# Patient Record
Sex: Female | Born: 1945 | Race: White | Hispanic: No | Smoking: Never smoker
Health system: Southern US, Community
[De-identification: ages and names within clinical notes are randomized; demographics above are authoritative.]

## PROBLEM LIST (undated history)

## (undated) DIAGNOSIS — J45909 Unspecified asthma, uncomplicated: Secondary | ICD-10-CM

## (undated) DIAGNOSIS — J302 Other seasonal allergic rhinitis: Secondary | ICD-10-CM

## (undated) DIAGNOSIS — I10 Essential (primary) hypertension: Secondary | ICD-10-CM

## (undated) DIAGNOSIS — M2550 Pain in unspecified joint: Secondary | ICD-10-CM

## (undated) DIAGNOSIS — D0510 Intraductal carcinoma in situ of unspecified breast: Secondary | ICD-10-CM

## (undated) DIAGNOSIS — R011 Cardiac murmur, unspecified: Secondary | ICD-10-CM

## (undated) DIAGNOSIS — R112 Nausea with vomiting, unspecified: Secondary | ICD-10-CM

## (undated) DIAGNOSIS — E119 Type 2 diabetes mellitus without complications: Secondary | ICD-10-CM

## (undated) DIAGNOSIS — K219 Gastro-esophageal reflux disease without esophagitis: Secondary | ICD-10-CM

## (undated) DIAGNOSIS — Z9889 Other specified postprocedural states: Secondary | ICD-10-CM

## (undated) DIAGNOSIS — R51 Headache: Secondary | ICD-10-CM

## (undated) DIAGNOSIS — M1990 Unspecified osteoarthritis, unspecified site: Secondary | ICD-10-CM

## (undated) DIAGNOSIS — R519 Headache, unspecified: Secondary | ICD-10-CM

## (undated) HISTORY — DX: Type 2 diabetes mellitus without complications: E11.9

## (undated) HISTORY — PX: TONSILLECTOMY: SUR1361

## (undated) HISTORY — PX: TOE SURGERY: SHX1073

## (undated) HISTORY — DX: Gastro-esophageal reflux disease without esophagitis: K21.9

## (undated) HISTORY — PX: TUBAL LIGATION: SHX77

## (undated) HISTORY — PX: BREAST SURGERY: SHX581

## (undated) HISTORY — PX: HAMMER TOE SURGERY: SHX385

## (undated) HISTORY — DX: Essential (primary) hypertension: I10

## (undated) HISTORY — PX: EYE SURGERY: SHX253

## (undated) HISTORY — DX: Unspecified asthma, uncomplicated: J45.909

---

## 2006-03-04 ENCOUNTER — Emergency Department: Payer: Self-pay | Admitting: Emergency Medicine

## 2008-03-25 ENCOUNTER — Ambulatory Visit: Payer: Self-pay | Admitting: Internal Medicine

## 2010-12-23 ENCOUNTER — Ambulatory Visit: Payer: Self-pay | Admitting: Internal Medicine

## 2011-01-03 ENCOUNTER — Ambulatory Visit: Payer: Self-pay | Admitting: Unknown Physician Specialty

## 2011-12-27 DIAGNOSIS — J45909 Unspecified asthma, uncomplicated: Secondary | ICD-10-CM | POA: Insufficient documentation

## 2012-07-24 DIAGNOSIS — L309 Dermatitis, unspecified: Secondary | ICD-10-CM | POA: Insufficient documentation

## 2014-03-28 DIAGNOSIS — K219 Gastro-esophageal reflux disease without esophagitis: Secondary | ICD-10-CM | POA: Insufficient documentation

## 2014-03-28 DIAGNOSIS — E1122 Type 2 diabetes mellitus with diabetic chronic kidney disease: Secondary | ICD-10-CM | POA: Insufficient documentation

## 2014-03-28 DIAGNOSIS — G4700 Insomnia, unspecified: Secondary | ICD-10-CM | POA: Insufficient documentation

## 2014-03-28 DIAGNOSIS — E785 Hyperlipidemia, unspecified: Secondary | ICD-10-CM | POA: Insufficient documentation

## 2014-09-07 ENCOUNTER — Encounter: Payer: Self-pay | Admitting: Podiatry

## 2014-09-07 ENCOUNTER — Ambulatory Visit (INDEPENDENT_AMBULATORY_CARE_PROVIDER_SITE_OTHER): Payer: Medicare Other | Admitting: Podiatry

## 2014-09-07 VITALS — BP 114/65 | HR 88 | Resp 16 | Ht 62.0 in | Wt 206.0 lb

## 2014-09-07 DIAGNOSIS — M79606 Pain in leg, unspecified: Secondary | ICD-10-CM

## 2014-09-07 DIAGNOSIS — B351 Tinea unguium: Secondary | ICD-10-CM

## 2014-09-07 DIAGNOSIS — Q828 Other specified congenital malformations of skin: Secondary | ICD-10-CM

## 2014-09-07 NOTE — Progress Notes (Signed)
She presents today concerned of a possible infection to the fibular border of the hallux right. She denies any trauma to the foot.  Objective: Vital signs are stable she is alert and oriented 3. Pulses are strongly palpable. Neurologic sensorium is intact. Nail plate appears to be intact margin is well coapted I see no signs of infection  Assessment: Nail dystrophy hallux right.  Plan: Follow up with me as needed

## 2015-05-21 ENCOUNTER — Encounter: Payer: Self-pay | Admitting: *Deleted

## 2015-05-25 NOTE — Discharge Instructions (Signed)

## 2015-05-26 ENCOUNTER — Ambulatory Visit
Admission: RE | Admit: 2015-05-26 | Discharge: 2015-05-26 | Disposition: A | Discharge status: Home / Self Care | Payer: Medicare Other | Source: Ambulatory Visit | Attending: Ophthalmology | Admitting: Ophthalmology

## 2015-05-26 ENCOUNTER — Ambulatory Visit: Payer: Medicare Other | Admitting: Anesthesiology

## 2015-05-26 ENCOUNTER — Encounter
Admission: RE | Disposition: A | Discharge status: Home / Self Care | Payer: Self-pay | Source: Ambulatory Visit | Attending: Ophthalmology

## 2015-05-26 DIAGNOSIS — E780 Pure hypercholesterolemia: Secondary | ICD-10-CM | POA: Insufficient documentation

## 2015-05-26 DIAGNOSIS — Z853 Personal history of malignant neoplasm of breast: Secondary | ICD-10-CM | POA: Insufficient documentation

## 2015-05-26 DIAGNOSIS — J45909 Unspecified asthma, uncomplicated: Secondary | ICD-10-CM | POA: Diagnosis not present

## 2015-05-26 DIAGNOSIS — H2512 Age-related nuclear cataract, left eye: Secondary | ICD-10-CM | POA: Diagnosis not present

## 2015-05-26 DIAGNOSIS — I10 Essential (primary) hypertension: Secondary | ICD-10-CM | POA: Insufficient documentation

## 2015-05-26 DIAGNOSIS — E119 Type 2 diabetes mellitus without complications: Secondary | ICD-10-CM | POA: Insufficient documentation

## 2015-05-26 DIAGNOSIS — R011 Cardiac murmur, unspecified: Secondary | ICD-10-CM | POA: Insufficient documentation

## 2015-05-26 HISTORY — DX: Headache: R51

## 2015-05-26 HISTORY — DX: Nausea with vomiting, unspecified: R11.2

## 2015-05-26 HISTORY — DX: Cardiac murmur, unspecified: R01.1

## 2015-05-26 HISTORY — PX: CATARACT EXTRACTION W/PHACO: SHX586

## 2015-05-26 HISTORY — DX: Other specified postprocedural states: Z98.890

## 2015-05-26 HISTORY — DX: Headache, unspecified: R51.9

## 2015-05-26 HISTORY — DX: Other seasonal allergic rhinitis: J30.2

## 2015-05-26 LAB — GLUCOSE, CAPILLARY
GLUCOSE-CAPILLARY: 131 mg/dL — AB (ref 65–99)
Glucose-Capillary: 125 mg/dL — ABNORMAL HIGH (ref 65–99)

## 2015-05-26 SURGERY — PHACOEMULSIFICATION, CATARACT, WITH IOL INSERTION
Anesthesia: Monitor Anesthesia Care | Laterality: Left | Wound class: Clean

## 2015-05-26 MED ORDER — BRIMONIDINE TARTRATE 0.2 % OP SOLN
OPHTHALMIC | Status: DC | PRN
  Administered 2015-05-26: 1 [drp] via OPHTHALMIC

## 2015-05-26 MED ORDER — EPINEPHRINE HCL 1 MG/ML IJ SOLN
INTRAOCULAR | Status: DC | PRN
  Administered 2015-05-26: 68 mL via OPHTHALMIC

## 2015-05-26 MED ORDER — LACTATED RINGERS IV SOLN: INTRAVENOUS | Status: DC

## 2015-05-26 MED ORDER — ARMC OPHTHALMIC DILATING GEL
1.0000 "application " | OPHTHALMIC | Status: DC | PRN
  Administered 2015-05-26 (×2): 1 via OPHTHALMIC

## 2015-05-26 MED ORDER — TETRACAINE HCL 0.5 % OP SOLN
1.0000 [drp] | OPHTHALMIC | Status: DC | PRN
  Administered 2015-05-26: 1 [drp] via OPHTHALMIC

## 2015-05-26 MED ORDER — NA HYALUR & NA CHOND-NA HYALUR 0.4-0.35 ML IO KIT
PACK | INTRAOCULAR | Status: DC | PRN
  Administered 2015-05-26: 1 mL via INTRAOCULAR

## 2015-05-26 MED ORDER — ONDANSETRON HCL 4 MG/2ML IJ SOLN
INTRAMUSCULAR | Status: DC | PRN
  Administered 2015-05-26: 4 mg via INTRAVENOUS

## 2015-05-26 MED ORDER — FENTANYL CITRATE (PF) 100 MCG/2ML IJ SOLN
INTRAMUSCULAR | Status: DC | PRN
  Administered 2015-05-26: 50 ug via INTRAVENOUS

## 2015-05-26 MED ORDER — CEFUROXIME OPHTHALMIC INJECTION 1 MG/0.1 ML
INJECTION | OPHTHALMIC | Status: DC | PRN
  Administered 2015-05-26: .3 mL via INTRACAMERAL

## 2015-05-26 MED ORDER — TIMOLOL MALEATE 0.5 % OP SOLN
OPHTHALMIC | Status: DC | PRN
  Administered 2015-05-26: 1 [drp] via OPHTHALMIC

## 2015-05-26 MED ORDER — MIDAZOLAM HCL 2 MG/2ML IJ SOLN
INTRAMUSCULAR | Status: DC | PRN
  Administered 2015-05-26: 1.5 mg via INTRAVENOUS

## 2015-05-26 MED ORDER — POVIDONE-IODINE 5 % OP SOLN
1.0000 "application " | OPHTHALMIC | Status: DC | PRN
  Administered 2015-05-26: 1 via OPHTHALMIC

## 2015-05-26 SURGICAL SUPPLY — 26 items
CANNULA ANT/CHMB 27GA (MISCELLANEOUS) ×3 IMPLANT
GLOVE SURG LX 7.5 STRW (GLOVE) ×2
GLOVE SURG LX STRL 7.5 STRW (GLOVE) ×1 IMPLANT
GLOVE SURG TRIUMPH 8.0 PF LTX (GLOVE) ×3 IMPLANT
GOWN STRL REUS W/ TWL LRG LVL3 (GOWN DISPOSABLE) ×2 IMPLANT
GOWN STRL REUS W/TWL LRG LVL3 (GOWN DISPOSABLE) ×4
LENS IOL TECNIS 19.5 (Intraocular Lens) ×3 IMPLANT
LENS IOL TECNIS MONO 1P 19.5 (Intraocular Lens) ×1 IMPLANT
MARKER SKIN SURG W/RULER VIO (MISCELLANEOUS) ×3 IMPLANT
NDL RETROBULBAR .5 NSTRL (NEEDLE) IMPLANT
NEEDLE FILTER BLUNT 18X 1/2SAF (NEEDLE) ×2
NEEDLE FILTER BLUNT 18X1 1/2 (NEEDLE) ×1 IMPLANT
PACK CATARACT BRASINGTON (MISCELLANEOUS) ×3 IMPLANT
PACK EYE AFTER SURG (MISCELLANEOUS) ×3 IMPLANT
PACK OPTHALMIC (MISCELLANEOUS) ×3 IMPLANT
RING MALYGIN 7.0 (MISCELLANEOUS) IMPLANT
SUT ETHILON 10-0 CS-B-6CS-B-6 (SUTURE)
SUT VICRYL  9 0 (SUTURE)
SUT VICRYL 9 0 (SUTURE) IMPLANT
SUTURE EHLN 10-0 CS-B-6CS-B-6 (SUTURE) IMPLANT
SYR 3ML LL SCALE MARK (SYRINGE) ×3 IMPLANT
SYR 5ML LL (SYRINGE) IMPLANT
SYR TB 1ML LUER SLIP (SYRINGE) ×3 IMPLANT
WATER STERILE IRR 250ML POUR (IV SOLUTION) ×3 IMPLANT
WATER STERILE IRR 500ML POUR (IV SOLUTION) IMPLANT
WIPE NON LINTING 3.25X3.25 (MISCELLANEOUS) ×3 IMPLANT

## 2015-05-26 NOTE — Transfer of Care (Signed)
Immediate Anesthesia Transfer of Care Note  Patient: Ruth Harris  Procedure(s) Performed: Procedure(s) with comments: CATARACT EXTRACTION PHACO AND INTRAOCULAR LENS PLACEMENT (IOC) (Left) - DIABETIC  Patient Location: PACU  Anesthesia Type: MAC  Level of Consciousness: awake, alert  and patient cooperative  Airway and Oxygen Therapy: Patient Spontanous Breathing and Patient connected to supplemental oxygen  Post-op Assessment: Post-op Vital signs reviewed, Patient's Cardiovascular Status Stable, Respiratory Function Stable, Patent Airway and No signs of Nausea or vomiting  Post-op Vital Signs: Reviewed and stable  Complications: No apparent anesthesia complications

## 2015-05-26 NOTE — Anesthesia Postprocedure Evaluation (Signed)
  Anesthesia Post-op Note  Patient: Ruth Harris  Procedure(s) Performed: Procedure(s) with comments: CATARACT EXTRACTION PHACO AND INTRAOCULAR LENS PLACEMENT (IOC) (Left) - DIABETIC  Anesthesia type:MAC  Patient location: PACU  Post pain: Pain level controlled  Post assessment: Post-op Vital signs reviewed, Patient's Cardiovascular Status Stable, Respiratory Function Stable, Patent Airway and No signs of Nausea or vomiting  Post vital signs: Reviewed and stable  Last Vitals:  Filed Vitals:   05/26/15 0809  BP: 138/65  Pulse: 77  Temp: 36.2 C  Resp: 17    Level of consciousness: awake, alert  and patient cooperative  Complications: No apparent anesthesia complications

## 2015-05-26 NOTE — Anesthesia Procedure Notes (Signed)
Procedure Name: MAC Performed by: Luby Seamans Pre-anesthesia Checklist: Patient identified, Emergency Drugs available, Suction available, Timeout performed and Patient being monitored Patient Re-evaluated:Patient Re-evaluated prior to inductionOxygen Delivery Method: Nasal cannula Placement Confirmation: positive ETCO2       

## 2015-05-26 NOTE — Op Note (Signed)
OPERATIVE NOTE  LOUIS GAW [id number] 05/26/2015   PREOPERATIVE DIAGNOSIS:  Nuclear sclerotic cataract left eye. H25.12   POSTOPERATIVE DIAGNOSIS:    Nuclear sclerotic cataract left eye.     PROCEDURE:  Phacoemusification with posterior chamber intraocular lens placement of the left eye   LENS:   Implant Name Type Inv. Item Serial No. Manufacturer Lot No. LRB No. Used  LENS IMPL INTRAOC ZCB00 19.5 - [id number] Intraocular Lens LENS IMPL INTRAOC ZCB00 19.5 [id number] AMO   Left 1        ULTRASOUND TIME: 16.3  % of 1 minutes 22 seconds, CDE 13.4  SURGEON:  Wyonia Hough, MD   ANESTHESIA:  Topical with tetracaine drops and 2% Xylocaine jelly.   COMPLICATIONS:  None.   DESCRIPTION OF PROCEDURE:  The patient was identified in the holding room and transported to the operating room and placed in the supine position under the operating microscope.  The left eye was identified as the operative eye and it was prepped and draped in the usual sterile ophthalmic fashion.   A 1 millimeter clear-corneal paracentesis was made at the 1:30 position.  The anterior chamber was filled with Viscoat viscoelastic.  A 2.4 millimeter keratome was used to make a near-clear corneal incision at the 10:30 position.  .  A curvilinear capsulorrhexis was made with a cystotome and capsulorrhexis forceps.  Balanced salt solution was used to hydrodissect and hydrodelineate the nucleus.   Phacoemulsification was then used in stop and chop fashion to remove the lens nucleus and epinucleus.  The remaining cortex was then removed using the irrigation and aspiration handpiece. Provisc was then placed into the capsular bag to distend it for lens placement.  A lens was then injected into the capsular bag.  The remaining viscoelastic was aspirated.   Wounds were hydrated with balanced salt solution.  The anterior chamber was inflated to a physiologic pressure with balanced salt solution.  No wound leaks were noted.  Cefuroxime 0.1 ml of a 10mg /ml solution was injected into the anterior chamber for a dose of 1 mg of intracameral antibiotic at the completion of the case.   Timolol and Brimonidine drops were applied to the eye.  The patient was taken to the recovery room in stable condition without complications of anesthesia or surgery.  Fleetwood Pierron 05/26/2015, 8:11 AM

## 2015-05-26 NOTE — H&P (Signed)
  The History and Physical notes were scanned in.  The patient remains stable and unchanged from the H&P.   Previous H&P reviewed, patient examined, and there are no changes.  Ruth Harris 05/26/2015 7:37 AM

## 2015-05-26 NOTE — Anesthesia Preprocedure Evaluation (Signed)
Anesthesia Evaluation  Patient identified by MRN, date of birth, ID band  Reviewed: Allergy & Precautions, H&P , NPO status , Patient's Chart, lab work & pertinent test results  Airway Mallampati: II  TM Distance: >3 FB Neck ROM: full    Dental no notable dental hx.    Pulmonary    Pulmonary exam normal       Cardiovascular hypertension, Rhythm:regular Rate:Normal     Neuro/Psych    GI/Hepatic   Endo/Other  diabetes  Renal/GU      Musculoskeletal   Abdominal   Peds  Hematology   Anesthesia Other Findings   Reproductive/Obstetrics                             Anesthesia Physical Anesthesia Plan  ASA: II  Anesthesia Plan: MAC   Post-op Pain Management:    Induction:   Airway Management Planned:   Additional Equipment:   Intra-op Plan:   Post-operative Plan:   Informed Consent: I have reviewed the patients History and Physical, chart, labs and discussed the procedure including the risks, benefits and alternatives for the proposed anesthesia with the patient or authorized representative who has indicated his/her understanding and acceptance.     Plan Discussed with: CRNA  Anesthesia Plan Comments:         Anesthesia Quick Evaluation

## 2015-05-27 ENCOUNTER — Encounter: Payer: Self-pay | Admitting: Ophthalmology

## 2015-06-04 ENCOUNTER — Encounter: Payer: Self-pay | Admitting: *Deleted

## 2015-06-08 NOTE — Discharge Instructions (Signed)

## 2015-06-09 ENCOUNTER — Encounter
Admission: RE | Disposition: A | Discharge status: Home / Self Care | Payer: Self-pay | Source: Ambulatory Visit | Attending: Ophthalmology

## 2015-06-09 ENCOUNTER — Ambulatory Visit
Admission: RE | Admit: 2015-06-09 | Discharge: 2015-06-09 | Disposition: A | Discharge status: Home / Self Care | Payer: Medicare Other | Source: Ambulatory Visit | Attending: Ophthalmology | Admitting: Ophthalmology

## 2015-06-09 ENCOUNTER — Ambulatory Visit: Payer: Medicare Other | Admitting: Anesthesiology

## 2015-06-09 DIAGNOSIS — E119 Type 2 diabetes mellitus without complications: Secondary | ICD-10-CM | POA: Insufficient documentation

## 2015-06-09 DIAGNOSIS — I10 Essential (primary) hypertension: Secondary | ICD-10-CM | POA: Diagnosis not present

## 2015-06-09 DIAGNOSIS — E780 Pure hypercholesterolemia: Secondary | ICD-10-CM | POA: Diagnosis not present

## 2015-06-09 DIAGNOSIS — J45909 Unspecified asthma, uncomplicated: Secondary | ICD-10-CM | POA: Diagnosis not present

## 2015-06-09 DIAGNOSIS — R011 Cardiac murmur, unspecified: Secondary | ICD-10-CM | POA: Diagnosis not present

## 2015-06-09 DIAGNOSIS — Z853 Personal history of malignant neoplasm of breast: Secondary | ICD-10-CM | POA: Insufficient documentation

## 2015-06-09 DIAGNOSIS — H2511 Age-related nuclear cataract, right eye: Secondary | ICD-10-CM | POA: Insufficient documentation

## 2015-06-09 HISTORY — PX: CATARACT EXTRACTION W/PHACO: SHX586

## 2015-06-09 LAB — GLUCOSE, CAPILLARY
GLUCOSE-CAPILLARY: 126 mg/dL — AB (ref 65–99)
Glucose-Capillary: 129 mg/dL — ABNORMAL HIGH (ref 65–99)

## 2015-06-09 SURGERY — PHACOEMULSIFICATION, CATARACT, WITH IOL INSERTION
Anesthesia: Monitor Anesthesia Care | Laterality: Right | Wound class: Clean

## 2015-06-09 MED ORDER — BRIMONIDINE TARTRATE 0.2 % OP SOLN
OPHTHALMIC | Status: DC | PRN
  Administered 2015-06-09: 1 [drp] via OPHTHALMIC

## 2015-06-09 MED ORDER — FENTANYL CITRATE (PF) 100 MCG/2ML IJ SOLN
INTRAMUSCULAR | Status: DC | PRN
  Administered 2015-06-09: 50 ug via INTRAVENOUS

## 2015-06-09 MED ORDER — LACTATED RINGERS IV SOLN: INTRAVENOUS | Status: DC

## 2015-06-09 MED ORDER — ARMC OPHTHALMIC DILATING GEL
1.0000 "application " | OPHTHALMIC | Status: DC | PRN
  Administered 2015-06-09 (×2): 1 via OPHTHALMIC

## 2015-06-09 MED ORDER — CEFUROXIME OPHTHALMIC INJECTION 1 MG/0.1 ML
INJECTION | OPHTHALMIC | Status: DC | PRN
  Administered 2015-06-09: 0.1 mL via INTRACAMERAL

## 2015-06-09 MED ORDER — PROPARACAINE HCL 0.5 % OP SOLN
1.0000 [drp] | Freq: Once | OPHTHALMIC | Status: AC
  Administered 2015-06-09: 1 [drp] via OPHTHALMIC

## 2015-06-09 MED ORDER — TIMOLOL MALEATE 0.5 % OP SOLN
OPHTHALMIC | Status: DC | PRN
  Administered 2015-06-09: 1 [drp] via OPHTHALMIC

## 2015-06-09 MED ORDER — MIDAZOLAM HCL 2 MG/2ML IJ SOLN
INTRAMUSCULAR | Status: DC | PRN
  Administered 2015-06-09: 2 mg via INTRAVENOUS

## 2015-06-09 MED ORDER — POVIDONE-IODINE 5 % OP SOLN
1.0000 "application " | OPHTHALMIC | Status: DC | PRN
  Administered 2015-06-09: 1 via OPHTHALMIC

## 2015-06-09 MED ORDER — EPINEPHRINE HCL 1 MG/ML IJ SOLN
INTRAOCULAR | Status: DC | PRN
  Administered 2015-06-09: 80 mL via OPHTHALMIC

## 2015-06-09 MED ORDER — NA HYALUR & NA CHOND-NA HYALUR 0.4-0.35 ML IO KIT
PACK | INTRAOCULAR | Status: DC | PRN
  Administered 2015-06-09: 1 mL via INTRAOCULAR

## 2015-06-09 SURGICAL SUPPLY — 26 items
CANNULA ANT/CHMB 27GA (MISCELLANEOUS) ×3 IMPLANT
GLOVE SURG LX 7.5 STRW (GLOVE) ×2
GLOVE SURG LX STRL 7.5 STRW (GLOVE) ×1 IMPLANT
GLOVE SURG TRIUMPH 8.0 PF LTX (GLOVE) ×3 IMPLANT
GOWN STRL REUS W/ TWL LRG LVL3 (GOWN DISPOSABLE) ×2 IMPLANT
GOWN STRL REUS W/TWL LRG LVL3 (GOWN DISPOSABLE) ×4
LENS IOL TECNIS 19.5 (Intraocular Lens) ×3 IMPLANT
LENS IOL TECNIS MONO 1P 19.5 (Intraocular Lens) ×1 IMPLANT
MARKER SKIN SURG W/RULER VIO (MISCELLANEOUS) ×3 IMPLANT
NDL RETROBULBAR .5 NSTRL (NEEDLE) IMPLANT
NEEDLE FILTER BLUNT 18X 1/2SAF (NEEDLE) ×2
NEEDLE FILTER BLUNT 18X1 1/2 (NEEDLE) ×1 IMPLANT
PACK CATARACT BRASINGTON (MISCELLANEOUS) ×3 IMPLANT
PACK EYE AFTER SURG (MISCELLANEOUS) ×3 IMPLANT
PACK OPTHALMIC (MISCELLANEOUS) ×3 IMPLANT
RING MALYGIN 7.0 (MISCELLANEOUS) IMPLANT
SUT ETHILON 10-0 CS-B-6CS-B-6 (SUTURE)
SUT VICRYL  9 0 (SUTURE)
SUT VICRYL 9 0 (SUTURE) IMPLANT
SUTURE EHLN 10-0 CS-B-6CS-B-6 (SUTURE) IMPLANT
SYR 3ML LL SCALE MARK (SYRINGE) ×3 IMPLANT
SYR 5ML LL (SYRINGE) IMPLANT
SYR TB 1ML LUER SLIP (SYRINGE) ×3 IMPLANT
WATER STERILE IRR 250ML POUR (IV SOLUTION) ×3 IMPLANT
WATER STERILE IRR 500ML POUR (IV SOLUTION) IMPLANT
WIPE NON LINTING 3.25X3.25 (MISCELLANEOUS) ×3 IMPLANT

## 2015-06-09 NOTE — Op Note (Signed)
LOCATION:  [place]   PREOPERATIVE DIAGNOSIS:    Nuclear sclerotic cataract right eye. H25.11   POSTOPERATIVE DIAGNOSIS:  Nuclear sclerotic cataract right eye.     PROCEDURE:  Phacoemusification with posterior chamber intraocular lens placement of the right eye   LENS:   Implant Name Type Inv. Item Serial No. Manufacturer Lot No. LRB No. Used  LENS IMPL INTRAOC ZCB00 19.5 - [id number] Intraocular Lens LENS IMPL INTRAOC ZCB00 19.5 [id number] AMO   Right 1        ULTRASOUND TIME: 13 % of 1 minutes, 22 seconds.  CDE 10.9   SURGEON:  Wyonia Hough, MD   ANESTHESIA:  Topical with tetracaine drops and 2% Xylocaine jelly.   COMPLICATIONS:  None.   DESCRIPTION OF PROCEDURE:  The patient was identified in the holding room and transported to the operating room and placed in the supine position under the operating microscope.  The right eye was identified as the operative eye and it was prepped and draped in the usual sterile ophthalmic fashion.   A 1 millimeter clear-corneal paracentesis was made at the 12:00 position.  The anterior chamber was filled with Viscoat viscoelastic.  A 2.4 millimeter keratome was used to make a near-clear corneal incision at the 9:00 position.  A curvilinear capsulorrhexis was made with a cystotome and capsulorrhexis forceps.  Balanced salt solution was used to hydrodissect and hydrodelineate the nucleus.   Phacoemulsification was then used in stop and chop fashion to remove the lens nucleus and epinucleus.  The remaining cortex was then removed using the irrigation and aspiration handpiece. Provisc was then placed into the capsular bag to distend it for lens placement.  A lens was then injected into the capsular bag.  The remaining viscoelastic was aspirated.   Wounds were hydrated with balanced salt solution.  The anterior chamber was inflated to a physiologic pressure with balanced salt solution.  No wound leaks were noted. Cefuroxime 0.1 ml of a  10mg /ml solution was injected into the anterior chamber for a dose of 1 mg of intracameral antibiotic at the completion of the case.   Timolol and Brimonidine drops were applied to the eye.  The patient was taken to the recovery room in stable condition without complications of anesthesia or surgery.   BRASINGTON,CHADWICK 06/09/2015, 7:59 AM

## 2015-06-09 NOTE — Anesthesia Preprocedure Evaluation (Signed)
Anesthesia Evaluation  Patient identified by MRN, date of birth, ID band  Reviewed: Allergy & Precautions, H&P , NPO status , Patient's Chart, lab work & pertinent test results  Airway Mallampati: II  TM Distance: >3 FB Neck ROM: full    Dental no notable dental hx.    Pulmonary asthma ,    Pulmonary exam normal       Cardiovascular hypertension, Rhythm:regular Rate:Normal     Neuro/Psych    GI/Hepatic   Endo/Other  diabetes  Renal/GU      Musculoskeletal   Abdominal   Peds  Hematology   Anesthesia Other Findings   Reproductive/Obstetrics                             Anesthesia Physical Anesthesia Plan  ASA: II  Anesthesia Plan: MAC   Post-op Pain Management:    Induction:   Airway Management Planned:   Additional Equipment:   Intra-op Plan:   Post-operative Plan:   Informed Consent: I have reviewed the patients History and Physical, chart, labs and discussed the procedure including the risks, benefits and alternatives for the proposed anesthesia with the patient or authorized representative who has indicated his/her understanding and acceptance.     Plan Discussed with: CRNA  Anesthesia Plan Comments:         Anesthesia Quick Evaluation

## 2015-06-09 NOTE — Anesthesia Procedure Notes (Signed)
Procedure Name: MAC Performed by: Ladina Shutters Pre-anesthesia Checklist: Patient identified, Emergency Drugs available, Suction available, Timeout performed and Patient being monitored Patient Re-evaluated:Patient Re-evaluated prior to inductionOxygen Delivery Method: Nasal cannula Placement Confirmation: positive ETCO2     

## 2015-06-09 NOTE — Anesthesia Postprocedure Evaluation (Signed)
  Anesthesia Post-op Note  Patient: Ruth Harris  Procedure(s) Performed: Procedure(s) with comments: CATARACT EXTRACTION PHACO AND INTRAOCULAR LENS PLACEMENT (IOC) (Right) - DIABETIC - oral meds  Anesthesia type:MAC  Patient location: PACU  Post pain: Pain level controlled  Post assessment: Post-op Vital signs reviewed, Patient's Cardiovascular Status Stable, Respiratory Function Stable, Patent Airway and No signs of Nausea or vomiting  Post vital signs: Reviewed and stable  Last Vitals:  Filed Vitals:   06/09/15 0815  BP: 143/66  Pulse: 79  Temp:   Resp: 15    Level of consciousness: awake, alert  and patient cooperative  Complications: No apparent anesthesia complications

## 2015-06-09 NOTE — Transfer of Care (Signed)
Immediate Anesthesia Transfer of Care Note  Patient: Ruth Harris  Procedure(s) Performed: Procedure(s) with comments: CATARACT EXTRACTION PHACO AND INTRAOCULAR LENS PLACEMENT (IOC) (Right) - DIABETIC - oral meds  Patient Location: PACU  Anesthesia Type: MAC  Level of Consciousness: awake, alert  and patient cooperative  Airway and Oxygen Therapy: Patient Spontanous Breathing and Patient connected to supplemental oxygen  Post-op Assessment: Post-op Vital signs reviewed, Patient's Cardiovascular Status Stable, Respiratory Function Stable, Patent Airway and No signs of Nausea or vomiting  Post-op Vital Signs: Reviewed and stable  Complications: No apparent anesthesia complications

## 2015-06-09 NOTE — H&P (Signed)
  The History and Physical notes were scanned in.  The patient remains stable and unchanged from the H&P.   Previous H&P reviewed, patient examined, and there are no changes.  Ruth Harris 06/09/2015 7:37 AM

## 2015-06-10 ENCOUNTER — Encounter: Payer: Self-pay | Admitting: Ophthalmology

## 2015-11-01 ENCOUNTER — Encounter: Payer: Self-pay | Admitting: Podiatry

## 2015-11-01 ENCOUNTER — Ambulatory Visit (INDEPENDENT_AMBULATORY_CARE_PROVIDER_SITE_OTHER): Payer: Medicare Other | Admitting: Podiatry

## 2015-11-01 DIAGNOSIS — M79676 Pain in unspecified toe(s): Secondary | ICD-10-CM

## 2015-11-01 DIAGNOSIS — Q828 Other specified congenital malformations of skin: Secondary | ICD-10-CM | POA: Diagnosis not present

## 2015-11-01 DIAGNOSIS — B351 Tinea unguium: Secondary | ICD-10-CM

## 2015-11-01 NOTE — Progress Notes (Signed)
She presents today with chief complaint of painful porokeratosis plantar aspect of the bilateral foot. She is also concerned of the distal pain to the second digit of the left foot.  Objective: Vital signs are stable alert and oriented 3 strong pulse bilateral. Multiple porokeratosis plantar aspect of the left foot single solitary porokeratosis right mid arch. No open lesions or wounds. Mild mallet toe deformity second digit left foot with an elongated toenail resulting in Stefanik nail dystrophy.  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 Painful porokeratosis bilateral.  Plan: Debridement of all reactive hyperkeratosis and a couple of stray toenails.

## 2015-11-04 DIAGNOSIS — M8580 Other specified disorders of bone density and structure, unspecified site: Secondary | ICD-10-CM | POA: Insufficient documentation

## 2015-12-08 ENCOUNTER — Encounter: Payer: Self-pay | Admitting: *Deleted

## 2015-12-09 ENCOUNTER — Encounter
Admission: RE | Disposition: A | Discharge status: Home / Self Care | Payer: Self-pay | Source: Ambulatory Visit | Attending: Unknown Physician Specialty

## 2015-12-09 ENCOUNTER — Ambulatory Visit: Payer: Medicare Other | Admitting: Anesthesiology

## 2015-12-09 ENCOUNTER — Ambulatory Visit
Admission: RE | Admit: 2015-12-09 | Discharge: 2015-12-09 | Disposition: A | Discharge status: Home / Self Care | Payer: Medicare Other | Source: Ambulatory Visit | Attending: Unknown Physician Specialty | Admitting: Unknown Physician Specialty

## 2015-12-09 DIAGNOSIS — D123 Benign neoplasm of transverse colon: Secondary | ICD-10-CM | POA: Insufficient documentation

## 2015-12-09 DIAGNOSIS — R51 Headache: Secondary | ICD-10-CM | POA: Diagnosis not present

## 2015-12-09 DIAGNOSIS — I10 Essential (primary) hypertension: Secondary | ICD-10-CM | POA: Insufficient documentation

## 2015-12-09 DIAGNOSIS — Z86010 Personal history of colonic polyps: Secondary | ICD-10-CM | POA: Diagnosis not present

## 2015-12-09 DIAGNOSIS — K219 Gastro-esophageal reflux disease without esophagitis: Secondary | ICD-10-CM | POA: Insufficient documentation

## 2015-12-09 DIAGNOSIS — K5730 Diverticulosis of large intestine without perforation or abscess without bleeding: Secondary | ICD-10-CM | POA: Diagnosis not present

## 2015-12-09 DIAGNOSIS — J45909 Unspecified asthma, uncomplicated: Secondary | ICD-10-CM | POA: Insufficient documentation

## 2015-12-09 DIAGNOSIS — Z9109 Other allergy status, other than to drugs and biological substances: Secondary | ICD-10-CM | POA: Diagnosis not present

## 2015-12-09 DIAGNOSIS — D125 Benign neoplasm of sigmoid colon: Secondary | ICD-10-CM | POA: Insufficient documentation

## 2015-12-09 DIAGNOSIS — Z79899 Other long term (current) drug therapy: Secondary | ICD-10-CM | POA: Insufficient documentation

## 2015-12-09 DIAGNOSIS — Z7951 Long term (current) use of inhaled steroids: Secondary | ICD-10-CM | POA: Diagnosis not present

## 2015-12-09 DIAGNOSIS — Z91048 Other nonmedicinal substance allergy status: Secondary | ICD-10-CM | POA: Diagnosis not present

## 2015-12-09 DIAGNOSIS — Z9842 Cataract extraction status, left eye: Secondary | ICD-10-CM | POA: Diagnosis not present

## 2015-12-09 DIAGNOSIS — M2550 Pain in unspecified joint: Secondary | ICD-10-CM | POA: Diagnosis not present

## 2015-12-09 DIAGNOSIS — Z888 Allergy status to other drugs, medicaments and biological substances status: Secondary | ICD-10-CM | POA: Insufficient documentation

## 2015-12-09 DIAGNOSIS — D120 Benign neoplasm of cecum: Secondary | ICD-10-CM | POA: Diagnosis not present

## 2015-12-09 DIAGNOSIS — Z7984 Long term (current) use of oral hypoglycemic drugs: Secondary | ICD-10-CM | POA: Insufficient documentation

## 2015-12-09 DIAGNOSIS — E119 Type 2 diabetes mellitus without complications: Secondary | ICD-10-CM | POA: Insufficient documentation

## 2015-12-09 DIAGNOSIS — Z9841 Cataract extraction status, right eye: Secondary | ICD-10-CM | POA: Insufficient documentation

## 2015-12-09 DIAGNOSIS — Z1211 Encounter for screening for malignant neoplasm of colon: Secondary | ICD-10-CM | POA: Insufficient documentation

## 2015-12-09 DIAGNOSIS — K621 Rectal polyp: Secondary | ICD-10-CM | POA: Diagnosis not present

## 2015-12-09 DIAGNOSIS — K640 First degree hemorrhoids: Secondary | ICD-10-CM | POA: Insufficient documentation

## 2015-12-09 DIAGNOSIS — R011 Cardiac murmur, unspecified: Secondary | ICD-10-CM | POA: Diagnosis not present

## 2015-12-09 HISTORY — DX: Pain in unspecified joint: M25.50

## 2015-12-09 HISTORY — PX: COLONOSCOPY WITH PROPOFOL: SHX5780

## 2015-12-09 SURGERY — COLONOSCOPY WITH PROPOFOL
Anesthesia: General

## 2015-12-09 MED ORDER — MIDAZOLAM HCL 5 MG/5ML IJ SOLN
INTRAMUSCULAR | Status: DC | PRN
  Administered 2015-12-09: 1 mg via INTRAVENOUS

## 2015-12-09 MED ORDER — SODIUM CHLORIDE 0.9 % IV SOLN: INTRAVENOUS | Status: DC

## 2015-12-09 MED ORDER — LIDOCAINE HCL (PF) 2 % IJ SOLN
INTRAMUSCULAR | Status: DC | PRN
  Administered 2015-12-09: 50 mg

## 2015-12-09 MED ORDER — PROPOFOL 500 MG/50ML IV EMUL
INTRAVENOUS | Status: DC | PRN
  Administered 2015-12-09: 75 ug/kg/min via INTRAVENOUS

## 2015-12-09 MED ORDER — FENTANYL CITRATE (PF) 100 MCG/2ML IJ SOLN
INTRAMUSCULAR | Status: DC | PRN
  Administered 2015-12-09: 50 ug via INTRAVENOUS

## 2015-12-09 MED ORDER — PROPOFOL 10 MG/ML IV BOLUS
INTRAVENOUS | Status: DC | PRN
  Administered 2015-12-09: 20 mg via INTRAVENOUS

## 2015-12-09 MED ORDER — SODIUM CHLORIDE 0.9 % IV SOLN
INTRAVENOUS | Status: DC
  Administered 2015-12-09: 1000 mL via INTRAVENOUS

## 2015-12-09 NOTE — Op Note (Signed)
[facility] Gastroenterology Patient Name: Ruth Harris Procedure Date: 12/09/2015 7:45 AM MRN: [id number] Account #: [id number] Date of Birth: 16-May-1946 Admit Type: Outpatient Age: 70 Room: [facility] ENDO ROOM 1 Gender: Female Note Status: Finalized Procedure:            Colonoscopy Indications:          High risk colon cancer surveillance: Personal history                        of colonic polyps Providers:            Manya Silvas, MD Referring MD:         Ocie Cornfield. Ouida Sills, MD (Referring MD) Medicines:            Propofol per Anesthesia Complications:        No immediate complications. Procedure:            Pre-Anesthesia Assessment:                       - After reviewing the risks and benefits, the patient                        was deemed in satisfactory condition to undergo the                        procedure.                       After obtaining informed consent, the colonoscope was                        passed under direct vision. Throughout the procedure,                        the patient's blood pressure, pulse, and oxygen                        saturations were monitored continuously. The                        Colonoscope was introduced through the anus and                        advanced to the the cecum, identified by appendiceal                        orifice and ileocecal valve. The colonoscopy was                        somewhat difficult due to restricted mobility of the                        colon. Successful completion of the procedure was aided                        by applying abdominal pressure. The patient tolerated                        the procedure well. The quality of the bowel  preparation was excellent. Findings:      A diminutive polyp was found in the cecum. The polyp was sessile. The       polyp was removed with a cold snare. Resection and retrieval were       complete.      A diminutive polyp  was found in the sigmoid colon. The polyp was       sessile. The polyp was removed with a jumbo cold forceps. Resection and       retrieval were complete.      Two sessile polyps were found in the transverse colon. The polyps were       diminutive in size. These polyps were removed with a cold biopsy       forceps. Resection and retrieval were complete.      A diminutive polyp was found in the rectum. The polyp was sessile. The       polyp was removed with a jumbo cold forceps. Resection and retrieval       were complete.      A single small-mouthed diverticulum was found in the sigmoid colon. It       had exudate in the tic and surrounding inflammation indicative of acute       diverticulitis.      Multiple small-mouthed diverticula were found in the sigmoid colon.      Internal hemorrhoids were found during endoscopy. The hemorrhoids were       small and Grade I (internal hemorrhoids that do not prolapse). Impression:           - One diminutive polyp in the cecum, removed with a                        cold snare. Resected and retrieved.                       - One diminutive polyp in the sigmoid colon, removed                        with a jumbo cold forceps. Resected and retrieved.                       - Two diminutive polyps in the transverse colon,                        removed with a cold biopsy forceps. Resected and                        retrieved.                       - One diminutive polyp in the rectum, removed with a                        jumbo cold forceps. Resected and retrieved.                       - Moderate diverticulosis in the sigmoid colon.                       - Diverticulosis in the sigmoid colon.                       -  Internal hemorrhoids. Recommendation:       - Await pathology results. Manya Silvas, MD 12/09/2015 8:30:24 AM This report has been signed electronically. Number of Addenda: 0 Note Initiated On: 12/09/2015 7:45 AM Scope Withdrawal Time: 0  hours 18 minutes 28 seconds  Total Procedure Duration: 0 hours 30 minutes 3 seconds       [facility]

## 2015-12-09 NOTE — Anesthesia Preprocedure Evaluation (Addendum)
Anesthesia Evaluation  Patient identified by MRN, date of birth, ID band Patient awake    Reviewed: Allergy & Precautions, H&P , NPO status , Patient's Chart, lab work & pertinent test results  History of Anesthesia Complications (+) PONV  Airway Mallampati: II  TM Distance: >3 FB Neck ROM: full    Dental no notable dental hx.    Pulmonary asthma ,    Pulmonary exam normal        Cardiovascular hypertension, Pt. on medications + Valvular Problems/Murmurs  Rhythm:regular Rate:Normal     Neuro/Psych  Headaches, negative psych ROS   GI/Hepatic Neg liver ROS, GERD  Medicated and Controlled,  Endo/Other  diabetes, Well Controlled, Type 2, Oral Hypoglycemic Agents  Renal/GU negative Renal ROS  negative genitourinary   Musculoskeletal negative musculoskeletal ROS (+)   Abdominal Normal abdominal exam  (+)   Peds negative pediatric ROS (+)  Hematology negative hematology ROS (+)   Anesthesia Other Findings   Reproductive/Obstetrics                            Anesthesia Physical  Anesthesia Plan  ASA: III  Anesthesia Plan: General   Post-op Pain Management:    Induction: Intravenous  Airway Management Planned: Nasal Cannula  Additional Equipment:   Intra-op Plan:   Post-operative Plan:   Informed Consent: I have reviewed the patients History and Physical, chart, labs and discussed the procedure including the risks, benefits and alternatives for the proposed anesthesia with the patient or authorized representative who has indicated his/her understanding and acceptance.   Dental advisory given  Plan Discussed with: CRNA and Surgeon  Anesthesia Plan Comments:         Anesthesia Quick Evaluation

## 2015-12-09 NOTE — Transfer of Care (Signed)
Immediate Anesthesia Transfer of Care Note  Patient: Ruth Harris  Procedure(s) Performed: Procedure(s): COLONOSCOPY WITH PROPOFOL (N/A)  Patient Location: PACU  Anesthesia Type:General  Level of Consciousness: sedated  Airway & Oxygen Therapy: Patient Spontanous Breathing and Patient connected to nasal cannula oxygen  Post-op Assessment: Report given to RN and Post -op Vital signs reviewed and stable  Post vital signs: Reviewed and stable  Last Vitals:  Filed Vitals:   12/09/15 0642  BP: 148/70  Pulse: 90  Temp: 36.9 C  Resp: 17    Complications: No apparent anesthesia complications

## 2015-12-09 NOTE — Anesthesia Postprocedure Evaluation (Signed)
Anesthesia Post Note  Patient: Ruth Harris  Procedure(s) Performed: Procedure(s) (LRB): COLONOSCOPY WITH PROPOFOL (N/A)  Patient location during evaluation: PACU Anesthesia Type: General Level of consciousness: awake and alert and oriented Pain management: pain level controlled Vital Signs Assessment: post-procedure vital signs reviewed and stable Respiratory status: spontaneous breathing Cardiovascular status: blood pressure returned to baseline Anesthetic complications: no    Last Vitals:  Filed Vitals:   12/09/15 0851 12/09/15 0901  BP: 120/65 80/71  Pulse: 78 79  Temp:    Resp: 22 17    Last Pain: There were no vitals filed for this visit.               Bailie Christenbury

## 2015-12-09 NOTE — H&P (Signed)
Primary Care Physician:  Kirk Ruths., MD Primary Gastroenterologist:  Dr. Vira Agar  Pre-Procedure History & Physical: HPI:  Ruth Harris is a 70 y.o. female is here for an colonoscopy.   Past Medical History  Diagnosis Date  . Diabetes mellitus without complication ([place])   . Hypertension   . GERD (gastroesophageal reflux disease)   . Asthma   . PONV (postoperative nausea and vomiting)   . Heart murmur     Told she had murmur after a surgery. No issues.  . Headache     "from eye strain"  . Seasonal allergies   . Arthralgia     Past Surgical History  Procedure Laterality Date  . Breast surgery      DCIS, lumpectomy  . Tubal ligation    . Hammer toe surgery Right   . Toe surgery Right     corn removed from 5th toe  . Cataract extraction w/phaco Left 05/26/2015    Procedure: CATARACT EXTRACTION PHACO AND INTRAOCULAR LENS PLACEMENT (IOC);  Surgeon: Leandrew Koyanagi, MD;  Location: [place];  Service: Ophthalmology;  Laterality: Left;  DIABETIC  . Cataract extraction w/phaco Right 06/09/2015    Procedure: CATARACT EXTRACTION PHACO AND INTRAOCULAR LENS PLACEMENT (IOC);  Surgeon: Leandrew Koyanagi, MD;  Location: [place];  Service: Ophthalmology;  Laterality: Right;  DIABETIC - oral meds  . Eye surgery      Prior to Admission medications   Medication Sig Start Date End Date Taking? Authorizing Provider  albuterol (PROAIR HFA) 108 (90 BASE) MCG/ACT inhaler Inhale into the lungs.    Historical Provider, MD  atorvastatin (LIPITOR) 20 MG tablet Take by mouth. 03/09/14   Historical Provider, MD  budesonide-formoterol (SYMBICORT) 160-4.5 MCG/ACT inhaler Inhale into the lungs.    Historical Provider, MD  Cholecalciferol (VITAMIN D3) 2000 UNITS capsule Take by mouth.    Historical Provider, MD  Cyanocobalamin (VITAMIN B-12 PO) Take by mouth.    Historical Provider, MD  dapagliflozin propanediol (FARXIGA) 10 MG TABS tablet Take 10 mg by mouth daily. In  research study. Not sure if getting medication or placebo.    Historical Provider, MD  metFORMIN (GLUCOPHAGE-XR) 500 MG 24 hr tablet Take by mouth. 03/09/14   Historical Provider, MD  ranitidine (ZANTAC) 75 MG tablet Take 75 mg by mouth 2 (two) times daily as needed for heartburn.    Historical Provider, MD  torsemide (DEMADEX) 5 MG tablet Take by mouth. 03/09/14   Historical Provider, MD    Allergies as of 12/02/2015 - Review Complete 11/01/2015  Allergen Reaction Noted  . Trazodone & diet manage prod Itching 09/07/2014  . Shellac Rash 09/07/2014  . Trazodone Rash 09/07/2014    History reviewed. No pertinent family history.  Social History   Social History  . Marital Status: Widowed    Spouse Name: N/A  . Number of Children: N/A  . Years of Education: N/A   Occupational History  . Not on file.   Social History Main Topics  . Smoking status: Never Smoker   . Smokeless tobacco: Not on file  . Alcohol Use: No  . Drug Use: No  . Sexual Activity: Not on file   Other Topics Concern  . Not on file   Social History Narrative    Review of Systems: See HPI, otherwise negative ROS  Physical Exam: BP 148/70 mmHg  Pulse 90  Temp(Src) 98.5 F (36.9 C) (Tympanic)  Resp 17  Ht 5\' 2"  (1.575 m)  Wt 90.719 kg (200  lb)  BMI 36.57 kg/m2  SpO2 97% General:   Alert,  pleasant and cooperative in NAD Head:  Normocephalic and atraumatic. Neck:  Supple; no masses or thyromegaly. Lungs:  Clear throughout to auscultation.    Heart:  Regular rate and rhythm. Abdomen:  Soft, nontender and nondistended. Normal bowel sounds, without guarding, and without rebound.   Neurologic:  Alert and  oriented x4;  grossly normal neurologically.  Impression/Plan: Ruth Harris is here for an colonoscopy to be performed for [facility] colon polyps  Risks, benefits, limitations, and alternatives regarding  colonoscopy have been reviewed with the patient.  Questions have been answered.  All parties  agreeable.   Gaylyn Cheers, MD  12/09/2015, 7:26 AM

## 2015-12-10 LAB — SURGICAL PATHOLOGY

## 2016-03-15 ENCOUNTER — Ambulatory Visit (INDEPENDENT_AMBULATORY_CARE_PROVIDER_SITE_OTHER): Payer: Medicare Other | Admitting: Podiatry

## 2016-03-15 ENCOUNTER — Ambulatory Visit (INDEPENDENT_AMBULATORY_CARE_PROVIDER_SITE_OTHER): Payer: Medicare Other

## 2016-03-15 DIAGNOSIS — M79671 Pain in right foot: Secondary | ICD-10-CM

## 2016-03-15 DIAGNOSIS — M722 Plantar fascial fibromatosis: Secondary | ICD-10-CM | POA: Diagnosis not present

## 2016-03-15 DIAGNOSIS — Q828 Other specified congenital malformations of skin: Secondary | ICD-10-CM

## 2016-03-15 MED ORDER — DICLOFENAC SODIUM 1 % TD GEL: 4.0000 g | Freq: Four times a day (QID) | TRANSDERMAL | Status: DC

## 2016-03-15 NOTE — Progress Notes (Signed)
She presents today with a chief complaint of multiple porokeratotic lesions plantar aspect bilateral foot particularly sub-fifth metatarsal head of the left foot. She is also complaining of some residual plantar fasciitis of the left heel. And some pain overlying the fifth metatarsal of the right foot. She denies any trauma to either of his feet.  Objective: Vital signs are stable she is alert and oriented 3. Pulses are palpable. Porokeratotic lesions plantar aspect bilateral foot sub-fifth metatarsal head is largest and most painful. She has some residual plantar fasciitis of the left heel. It does not one injection. She also has some tenderness on palpation of the fifth metatarsal of the right foot along its length from base to head. Radial grafts do not demonstrate any type of osseus abnormalities in this area.  Assessment: Neuritis neuralgia fifth metatarsal right foot. Plantar fasciitis left foot. Porokeratosis bilateral.  Plan: Debridement of all reactive hyperkeratoses. Placed salicylic acid under occlusion fifth metatarsal head of the left foot. She will leave this on for 3 days not get it wet in the wash it off thoroughly. Also placed her in a plantar fascia brace to the left heel and prescribed diclofenac gel will follow up with her in 1 month or as needed.

## 2016-03-30 ENCOUNTER — Telehealth: Payer: Self-pay | Admitting: *Deleted

## 2016-03-30 MED ORDER — NONFORMULARY OR COMPOUNDED ITEM: Status: DC

## 2016-03-30 NOTE — Telephone Encounter (Addendum)
Pt's [employer] Medicare through [employer] denied coverage of Diclofenac Sodium 1% gel. Dr. Milinda Pointer ordered [place] cream. Faxed to [facility]. Left message informing pt Dr. Milinda Pointer had changed rx due to insurance not covering and gave [employer] phone.

## 2016-03-30 NOTE — Telephone Encounter (Signed)
Ok sounds good

## 2018-01-07 ENCOUNTER — Encounter: Payer: Self-pay | Admitting: Podiatry

## 2018-01-07 ENCOUNTER — Ambulatory Visit (INDEPENDENT_AMBULATORY_CARE_PROVIDER_SITE_OTHER): Payer: Medicare HMO | Admitting: Podiatry

## 2018-01-07 DIAGNOSIS — E119 Type 2 diabetes mellitus without complications: Secondary | ICD-10-CM | POA: Diagnosis not present

## 2018-01-07 DIAGNOSIS — B351 Tinea unguium: Secondary | ICD-10-CM | POA: Diagnosis not present

## 2018-01-07 DIAGNOSIS — Q828 Other specified congenital malformations of skin: Secondary | ICD-10-CM

## 2018-01-07 DIAGNOSIS — M79676 Pain in unspecified toe(s): Secondary | ICD-10-CM

## 2018-01-07 NOTE — Progress Notes (Signed)
She presents today for follow-up of a painful lesion plantar aspect of the left forefoot.  Also has painful elongated toenails.  Objective: Signs are stable she is alert and oriented x3.  Pulses are palpable.  She has pain on palpation fifth metatarsal head secondary to a porokeratotic lesion sub-fifth met head.  Toenails are long thick yellow dystrophic click mycotic.  Assessment: Pain in limb secondary to onychomycosis.  Porokeratosis sub-fifth met left.  Plan: Sharp debridement of the lesion today.  Salicylic acid was placed under occlusion of the left dry for 3 days then washed off thoroughly.  Debrided toenails 1 through 5 bilateral.

## 2018-04-19 ENCOUNTER — Other Ambulatory Visit: Payer: Self-pay | Admitting: Student

## 2018-04-19 DIAGNOSIS — G8929 Other chronic pain: Secondary | ICD-10-CM

## 2018-04-19 DIAGNOSIS — M7582 Other shoulder lesions, left shoulder: Secondary | ICD-10-CM

## 2018-04-19 DIAGNOSIS — M25512 Pain in left shoulder: Secondary | ICD-10-CM

## 2018-04-19 DIAGNOSIS — M533 Sacrococcygeal disorders, not elsewhere classified: Secondary | ICD-10-CM

## 2018-04-30 ENCOUNTER — Other Ambulatory Visit: Payer: Self-pay | Admitting: Internal Medicine

## 2018-04-30 DIAGNOSIS — Z1231 Encounter for screening mammogram for malignant neoplasm of breast: Secondary | ICD-10-CM

## 2018-05-01 ENCOUNTER — Encounter (INDEPENDENT_AMBULATORY_CARE_PROVIDER_SITE_OTHER): Payer: Self-pay

## 2018-05-01 ENCOUNTER — Ambulatory Visit
Admission: RE | Admit: 2018-05-01 | Discharge: 2018-05-01 | Disposition: A | Discharge status: Home / Self Care | Payer: Medicare HMO | Source: Ambulatory Visit | Attending: Student | Admitting: Student

## 2018-05-01 DIAGNOSIS — M7582 Other shoulder lesions, left shoulder: Secondary | ICD-10-CM | POA: Insufficient documentation

## 2018-05-01 DIAGNOSIS — M533 Sacrococcygeal disorders, not elsewhere classified: Secondary | ICD-10-CM | POA: Diagnosis not present

## 2018-05-01 DIAGNOSIS — M25512 Pain in left shoulder: Secondary | ICD-10-CM | POA: Insufficient documentation

## 2018-05-01 DIAGNOSIS — G8929 Other chronic pain: Secondary | ICD-10-CM | POA: Insufficient documentation

## 2018-05-01 DIAGNOSIS — M7552 Bursitis of left shoulder: Secondary | ICD-10-CM | POA: Insufficient documentation

## 2018-09-09 DIAGNOSIS — Z853 Personal history of malignant neoplasm of breast: Secondary | ICD-10-CM | POA: Insufficient documentation

## 2018-11-09 ENCOUNTER — Other Ambulatory Visit
Admission: RE | Admit: 2018-11-09 | Discharge: 2018-11-09 | Disposition: A | Discharge status: Home / Self Care | Payer: Medicare HMO | Source: Ambulatory Visit | Attending: Student | Admitting: Student

## 2018-11-09 DIAGNOSIS — K529 Noninfective gastroenteritis and colitis, unspecified: Secondary | ICD-10-CM | POA: Insufficient documentation

## 2018-11-09 LAB — GASTROINTESTINAL PANEL BY PCR, STOOL (REPLACES STOOL CULTURE)

## 2018-11-09 LAB — C DIFFICILE QUICK SCREEN W PCR REFLEX
C DIFFICILE (CDIFF) INTERP: NOT DETECTED
C Diff antigen: NEGATIVE
C Diff toxin: NEGATIVE

## 2018-11-13 LAB — CALPROTECTIN, FECAL: Calprotectin, Fecal: 26 ug/g (ref 0–120)

## 2018-11-20 ENCOUNTER — Other Ambulatory Visit: Payer: Self-pay | Admitting: Radiology

## 2018-11-20 ENCOUNTER — Other Ambulatory Visit: Payer: Self-pay | Admitting: Orthopedic Surgery

## 2018-11-20 DIAGNOSIS — G8929 Other chronic pain: Secondary | ICD-10-CM

## 2018-11-20 DIAGNOSIS — M25512 Pain in left shoulder: Principal | ICD-10-CM

## 2018-11-24 ENCOUNTER — Other Ambulatory Visit
Admission: RE | Admit: 2018-11-24 | Discharge: 2018-11-24 | Disposition: A | Discharge status: Home / Self Care | Payer: Medicare HMO | Source: Ambulatory Visit | Attending: Student | Admitting: Student

## 2018-11-24 DIAGNOSIS — K529 Noninfective gastroenteritis and colitis, unspecified: Secondary | ICD-10-CM | POA: Diagnosis present

## 2018-11-27 LAB — PANCREATIC ELASTASE, FECAL: Pancreatic Elastase-1, Stool: >500 ug Elast./g (ref >200)

## 2018-12-02 ENCOUNTER — Ambulatory Visit
Admission: RE | Admit: 2018-12-02 | Discharge: 2018-12-02 | Disposition: A | Discharge status: Home / Self Care | Payer: Medicare HMO | Source: Ambulatory Visit | Attending: Orthopedic Surgery | Admitting: Orthopedic Surgery

## 2018-12-02 DIAGNOSIS — G8929 Other chronic pain: Secondary | ICD-10-CM

## 2018-12-02 DIAGNOSIS — M25512 Pain in left shoulder: Secondary | ICD-10-CM | POA: Insufficient documentation

## 2018-12-02 DIAGNOSIS — M19012 Primary osteoarthritis, left shoulder: Secondary | ICD-10-CM | POA: Diagnosis not present

## 2018-12-02 DIAGNOSIS — M7552 Bursitis of left shoulder: Secondary | ICD-10-CM | POA: Insufficient documentation

## 2018-12-02 MED ORDER — SODIUM CHLORIDE (PF) 0.9 % IJ SOLN
10.0000 mL | INTRAMUSCULAR | Status: DC | PRN
  Administered 2018-12-02: 10 mL

## 2018-12-02 MED ORDER — GADOBUTROL 1 MMOL/ML IV SOLN
0.0500 mL | Freq: Once | INTRAVENOUS | Status: AC | PRN
  Administered 2018-12-02: 0.05 mL

## 2018-12-02 MED ORDER — IOPAMIDOL (ISOVUE-300) INJECTION 61%
10.0000 mL | Freq: Once | INTRAVENOUS | Status: AC | PRN
  Administered 2018-12-02: 10 mL

## 2018-12-02 MED ORDER — LIDOCAINE HCL (PF) 1 % IJ SOLN
5.0000 mL | Freq: Once | INTRAMUSCULAR | Status: AC
  Administered 2018-12-02: 5 mL
  Filled 2018-12-02: qty 5

## 2018-12-05 ENCOUNTER — Other Ambulatory Visit: Payer: Self-pay | Admitting: Orthopedic Surgery

## 2018-12-10 ENCOUNTER — Other Ambulatory Visit: Payer: Self-pay

## 2018-12-10 ENCOUNTER — Encounter
Admission: RE | Admit: 2018-12-10 | Discharge: 2018-12-10 | Disposition: A | Discharge status: Home / Self Care | Payer: Medicare HMO | Source: Ambulatory Visit | Attending: Orthopedic Surgery | Admitting: Orthopedic Surgery

## 2018-12-10 DIAGNOSIS — E119 Type 2 diabetes mellitus without complications: Secondary | ICD-10-CM | POA: Diagnosis not present

## 2018-12-10 DIAGNOSIS — R9431 Abnormal electrocardiogram [ECG] [EKG]: Secondary | ICD-10-CM | POA: Insufficient documentation

## 2018-12-10 DIAGNOSIS — I10 Essential (primary) hypertension: Secondary | ICD-10-CM | POA: Insufficient documentation

## 2018-12-10 DIAGNOSIS — Z01818 Encounter for other preprocedural examination: Secondary | ICD-10-CM | POA: Insufficient documentation

## 2018-12-10 HISTORY — DX: Intraductal carcinoma in situ of unspecified breast: D05.10

## 2018-12-10 LAB — BASIC METABOLIC PANEL
Anion gap: 11 (ref 5–15)
BUN: 15 mg/dL (ref 8–23)
CHLORIDE: 103 mmol/L (ref 98–111)
CO2: 25 mmol/L (ref 22–32)
Calcium: 9.7 mg/dL (ref 8.9–10.3)
Creatinine, Ser: 0.64 mg/dL (ref 0.44–1.00)
GFR calc Af Amer: >60 mL/min (ref >60)
GFR calc non Af Amer: >60 mL/min (ref >60)
Glucose, Bld: 123 mg/dL — ABNORMAL HIGH (ref 70–99)
Potassium: 4.3 mmol/L (ref 3.5–5.1)
Sodium: 139 mmol/L (ref 135–145)

## 2018-12-10 LAB — CBC WITH DIFFERENTIAL/PLATELET
Abs Immature Granulocytes: 0.04 10*3/uL (ref 0.00–0.07)
Basophils Absolute: 0 10*3/uL (ref 0.0–0.1)
Basophils Relative: 0 %
Eosinophils Absolute: 0.1 10*3/uL (ref 0.0–0.5)
Eosinophils Relative: 1 %
HCT: 42.2 % (ref 36.0–46.0)
Hemoglobin: 13.5 g/dL (ref 12.0–15.0)
IMMATURE GRANULOCYTES: 0 %
LYMPHS PCT: 14 %
Lymphs Abs: 1.3 10*3/uL (ref 0.7–4.0)
MCH: 29 pg (ref 26.0–34.0)
MCHC: 32 g/dL (ref 30.0–36.0)
MCV: 90.8 fL (ref 80.0–100.0)
Monocytes Absolute: 0.8 10*3/uL (ref 0.1–1.0)
Monocytes Relative: 8 %
Neutro Abs: 7.3 10*3/uL (ref 1.7–7.7)
Neutrophils Relative %: 77 %
Platelets: 330 10*3/uL (ref 150–400)
RBC: 4.65 MIL/uL (ref 3.87–5.11)
RDW: 12.6 % (ref 11.5–15.5)
WBC: 9.6 10*3/uL (ref 4.0–10.5)
nRBC: 0 % (ref 0.0–0.2)

## 2018-12-10 LAB — PROTIME-INR
INR: 1 (ref 0.8–1.2)
Prothrombin Time: 12.8 seconds (ref 11.4–15.2)

## 2018-12-10 LAB — APTT: aPTT: 29 seconds (ref 24–36)

## 2018-12-10 NOTE — Patient Instructions (Signed)
Your procedure is scheduled on: Thursday 12/19/18 Report to [place]. To find out your arrival time please call [phone] between 1PM - 3PM on Wednesday 12/18/18.  Remember: Instructions that are not followed completely may result in serious medical risk, up to and including death, or upon the discretion of your surgeon and anesthesiologist your surgery may need to be rescheduled.     _X__ 1. Do not eat food after midnight the night before your procedure.                 No gum chewing or hard candies. You may drink clear liquids up to 2 hours                 before you are scheduled to arrive for your surgery- DO not drink clear                 liquids within 2 hours of the start of your surgery.                 Clear Liquids include:  water, apple juice without pulp, clear carbohydrate                 drink such as Clearfast or Gatorade, Black Coffee or Tea (Do not add                 anything to coffee or tea).  __X__2.  On the morning of surgery brush your teeth with toothpaste and water, you                 may rinse your mouth with mouthwash if you wish.  Do not swallow any              toothpaste of mouthwash.     _X__ 3.  No Alcohol for 24 hours before or after surgery.   _X__ 4.  Do Not Smoke or use e-cigarettes For 24 Hours Prior to Your Surgery.                 Do not use any chewable tobacco products for at least 6 hours prior to                 surgery.  ____  5.  Bring all medications with you on the day of surgery if instructed.   __X__  6.  Notify your doctor if there is any change in your medical condition      (cold, fever, infections).     Do not wear jewelry, make-up, hairpins, clips or nail polish. Do not wear lotions, powders, or perfumes.  Do not shave 48 hours prior to surgery. Men may shave face and neck. Do not bring valuables to the hospital.    [facility] is not responsible for any belongings or  valuables.  Contacts, dentures/partials or body piercings may not be worn into surgery. Bring a case for your contacts, glasses or hearing aids, a denture cup will be supplied. Leave your suitcase in the car. After surgery it may be brought to your room. For patients admitted to the hospital, discharge time is determined by your treatment team.   Patients discharged the day of surgery will not be allowed to drive home.   Please read over the following fact sheets that you were given:   MRSA Information  __X__ Take these medicines the morning of surgery with A SIP OF WATER:  1. atorvastatin (LIPITOR)  2. pantoprazole (PROTONIX)   3.   4.  5.  6.  ____ Fleet Enema (as directed)   __X__ Use CHG Soap/SAGE wipes as directed  ____ Use inhalers on the day of surgery  __X__ Stop metformin 2 days prior to surgery    ____ Take 1/2 of usual insulin dose the night before surgery. No insulin the morning          of surgery.   ____ Stop Blood Thinners Coumadin/Plavix/Xarelto/Pleta/Pradaxa/Eliquis/Effient/Aspirin  on   Or contact your Surgeon, Cardiologist or Medical Doctor regarding  ability to stop your blood thinners  __X__ Stop Anti-inflammatories 7 days before surgery such as Advil, Ibuprofen, Motrin,  BC or Goodies Powder, Naprosyn, Naproxen, Aleve, Aspirin   TYLENOL OK  __X__ Stop all herbal supplements, fish oil or vitamin E until after surgery.    ____ Bring C-Pap to the hospital.

## 2018-12-10 NOTE — Pre-Procedure Instructions (Signed)
Normal sinus rhythm Low voltage QRS Cannot rule out Anterior infarct , age undetermined Abnormal ECG When compared with ECG of 05-Jul-1998 10:07, No significant change was found   Preliminary EKG results note no change, note above.

## 2018-12-17 NOTE — Pre-Procedure Instructions (Signed)
Patient called with questions about her medication to take the morning of surgery.  She will take her protonix but not her losartan.  Reminded to hold her Metformin for 2 days before the procedure.

## 2018-12-18 MED ORDER — CEFAZOLIN SODIUM-DEXTROSE 2-4 GM/100ML-% IV SOLN
2.0000 g | INTRAVENOUS | Status: AC
  Administered 2018-12-19: 2 g via INTRAVENOUS

## 2018-12-19 ENCOUNTER — Ambulatory Visit: Payer: Medicare HMO | Admitting: Anesthesiology

## 2018-12-19 ENCOUNTER — Other Ambulatory Visit: Payer: Self-pay

## 2018-12-19 ENCOUNTER — Encounter
Admission: RE | Disposition: A | Discharge status: Home / Self Care | Payer: Self-pay | Source: Home / Self Care | Attending: Orthopedic Surgery

## 2018-12-19 ENCOUNTER — Ambulatory Visit
Admission: RE | Admit: 2018-12-19 | Discharge: 2018-12-19 | Disposition: A | Discharge status: Home / Self Care | Payer: Medicare HMO | Attending: Orthopedic Surgery | Admitting: Orthopedic Surgery

## 2018-12-19 DIAGNOSIS — Z9841 Cataract extraction status, right eye: Secondary | ICD-10-CM | POA: Diagnosis not present

## 2018-12-19 DIAGNOSIS — I10 Essential (primary) hypertension: Secondary | ICD-10-CM | POA: Diagnosis not present

## 2018-12-19 DIAGNOSIS — Z7984 Long term (current) use of oral hypoglycemic drugs: Secondary | ICD-10-CM | POA: Diagnosis not present

## 2018-12-19 DIAGNOSIS — Z853 Personal history of malignant neoplasm of breast: Secondary | ICD-10-CM | POA: Diagnosis not present

## 2018-12-19 DIAGNOSIS — Z791 Long term (current) use of non-steroidal anti-inflammatories (NSAID): Secondary | ICD-10-CM | POA: Insufficient documentation

## 2018-12-19 DIAGNOSIS — J45909 Unspecified asthma, uncomplicated: Secondary | ICD-10-CM | POA: Diagnosis not present

## 2018-12-19 DIAGNOSIS — Z9842 Cataract extraction status, left eye: Secondary | ICD-10-CM | POA: Diagnosis not present

## 2018-12-19 DIAGNOSIS — M75112 Incomplete rotator cuff tear or rupture of left shoulder, not specified as traumatic: Secondary | ICD-10-CM | POA: Insufficient documentation

## 2018-12-19 DIAGNOSIS — E119 Type 2 diabetes mellitus without complications: Secondary | ICD-10-CM | POA: Diagnosis not present

## 2018-12-19 DIAGNOSIS — M2550 Pain in unspecified joint: Secondary | ICD-10-CM | POA: Diagnosis not present

## 2018-12-19 DIAGNOSIS — Z79899 Other long term (current) drug therapy: Secondary | ICD-10-CM | POA: Insufficient documentation

## 2018-12-19 DIAGNOSIS — K219 Gastro-esophageal reflux disease without esophagitis: Secondary | ICD-10-CM | POA: Insufficient documentation

## 2018-12-19 DIAGNOSIS — R51 Headache: Secondary | ICD-10-CM | POA: Diagnosis not present

## 2018-12-19 DIAGNOSIS — Z888 Allergy status to other drugs, medicaments and biological substances status: Secondary | ICD-10-CM | POA: Insufficient documentation

## 2018-12-19 HISTORY — PX: SHOULDER ARTHROSCOPY WITH OPEN ROTATOR CUFF REPAIR: SHX6092

## 2018-12-19 LAB — GLUCOSE, CAPILLARY
Glucose-Capillary: 124 mg/dL — ABNORMAL HIGH (ref 70–99)
Glucose-Capillary: 146 mg/dL — ABNORMAL HIGH (ref 70–99)

## 2018-12-19 SURGERY — ARTHROSCOPY, SHOULDER WITH REPAIR, ROTATOR CUFF, OPEN
Anesthesia: General | Laterality: Left

## 2018-12-19 MED ORDER — FENTANYL CITRATE (PF) 100 MCG/2ML IJ SOLN
INTRAMUSCULAR | Status: AC
  Filled 2018-12-19: qty 2

## 2018-12-19 MED ORDER — PHENYLEPHRINE HCL 10 MG/ML IJ SOLN
INTRAMUSCULAR | Status: DC | PRN
  Administered 2018-12-19 (×2): 100 ug via INTRAVENOUS

## 2018-12-19 MED ORDER — MIDAZOLAM HCL 2 MG/2ML IJ SOLN
1.0000 mg | Freq: Once | INTRAMUSCULAR | Status: AC
  Administered 2018-12-19: 1 mg via INTRAVENOUS

## 2018-12-19 MED ORDER — FENTANYL CITRATE (PF) 100 MCG/2ML IJ SOLN
INTRAMUSCULAR | Status: AC
  Administered 2018-12-19: 100 ug
  Filled 2018-12-19: qty 2

## 2018-12-19 MED ORDER — OXYCODONE HCL 5 MG PO TABS
5.0000 mg | ORAL_TABLET | ORAL | Status: DC | PRN
  Administered 2018-12-19: 5 mg via ORAL
  Filled 2018-12-19: qty 1

## 2018-12-19 MED ORDER — SODIUM CHLORIDE 0.9 % IV SOLN
INTRAVENOUS | Status: DC
  Administered 2018-12-19: 1 mL via INTRAVENOUS

## 2018-12-19 MED ORDER — FENTANYL CITRATE (PF) 100 MCG/2ML IJ SOLN
25.0000 ug | Freq: Once | INTRAMUSCULAR | Status: AC
  Administered 2018-12-19: 25 ug via INTRAVENOUS

## 2018-12-19 MED ORDER — ONDANSETRON HCL 4 MG/2ML IJ SOLN: 4.0000 mg | Freq: Once | INTRAMUSCULAR | Status: DC | PRN

## 2018-12-19 MED ORDER — BUPIVACAINE HCL (PF) 0.5 % IJ SOLN
INTRAMUSCULAR | Status: AC
  Filled 2018-12-19: qty 10

## 2018-12-19 MED ORDER — CHLORHEXIDINE GLUCONATE CLOTH 2 % EX PADS: 6.0000 | MEDICATED_PAD | Freq: Once | CUTANEOUS | Status: DC

## 2018-12-19 MED ORDER — ROCURONIUM BROMIDE 100 MG/10ML IV SOLN
INTRAVENOUS | Status: DC | PRN
  Administered 2018-12-19: 10 mg via INTRAVENOUS

## 2018-12-19 MED ORDER — ONDANSETRON HCL 4 MG PO TABS: 4.0000 mg | ORAL_TABLET | Freq: Three times a day (TID) | ORAL | 0 refills | Status: DC | PRN

## 2018-12-19 MED ORDER — DEXAMETHASONE SODIUM PHOSPHATE 10 MG/ML IJ SOLN
INTRAMUSCULAR | Status: DC | PRN
  Administered 2018-12-19: 10 mg via INTRAVENOUS

## 2018-12-19 MED ORDER — ONDANSETRON HCL 4 MG/2ML IJ SOLN
INTRAMUSCULAR | Status: DC | PRN
  Administered 2018-12-19 (×2): 4 mg via INTRAVENOUS

## 2018-12-19 MED ORDER — FENTANYL CITRATE (PF) 100 MCG/2ML IJ SOLN
25.0000 ug | INTRAMUSCULAR | Status: AC | PRN
  Administered 2018-12-19 (×6): 25 ug via INTRAVENOUS

## 2018-12-19 MED ORDER — CEFAZOLIN SODIUM-DEXTROSE 2-4 GM/100ML-% IV SOLN
INTRAVENOUS | Status: AC
  Filled 2018-12-19: qty 100

## 2018-12-19 MED ORDER — SUCCINYLCHOLINE CHLORIDE 20 MG/ML IJ SOLN
INTRAMUSCULAR | Status: DC | PRN
  Administered 2018-12-19: 100 mg via INTRAVENOUS

## 2018-12-19 MED ORDER — PROPOFOL 10 MG/ML IV BOLUS
INTRAVENOUS | Status: AC
  Filled 2018-12-19: qty 20

## 2018-12-19 MED ORDER — LIDOCAINE HCL (PF) 1 % IJ SOLN
INTRAMUSCULAR | Status: AC
  Filled 2018-12-19: qty 5

## 2018-12-19 MED ORDER — MIDAZOLAM HCL 2 MG/2ML IJ SOLN
INTRAMUSCULAR | Status: AC
  Filled 2018-12-19: qty 2

## 2018-12-19 MED ORDER — SODIUM CHLORIDE 0.9 % IV SOLN
INTRAVENOUS | Status: DC | PRN
  Administered 2018-12-19: 40 ug/min via INTRAVENOUS

## 2018-12-19 MED ORDER — LIDOCAINE HCL (CARDIAC) PF 100 MG/5ML IV SOSY
PREFILLED_SYRINGE | INTRAVENOUS | Status: DC | PRN
  Administered 2018-12-19: 100 mg via INTRAVENOUS

## 2018-12-19 MED ORDER — PROPOFOL 10 MG/ML IV BOLUS
INTRAVENOUS | Status: DC | PRN
  Administered 2018-12-19: 170 mg via INTRAVENOUS

## 2018-12-19 MED ORDER — OXYCODONE HCL 5 MG PO TABS: 5.0000 mg | ORAL_TABLET | ORAL | 0 refills | Status: DC | PRN

## 2018-12-19 MED ORDER — OXYCODONE HCL 5 MG PO TABS
ORAL_TABLET | ORAL | Status: AC
  Filled 2018-12-19: qty 1

## 2018-12-19 MED ORDER — FENTANYL CITRATE (PF) 100 MCG/2ML IJ SOLN
INTRAMUSCULAR | Status: DC | PRN
  Administered 2018-12-19: 50 ug via INTRAVENOUS
  Administered 2018-12-19 (×2): 25 ug via INTRAVENOUS

## 2018-12-19 MED ORDER — BUPIVACAINE LIPOSOME 1.3 % IJ SUSP
INTRAMUSCULAR | Status: AC
  Filled 2018-12-19: qty 20

## 2018-12-19 SURGICAL SUPPLY — 72 items
ADAPTER IRRIG TUBE 2 SPIKE SOL (ADAPTER) ×6 IMPLANT
ANCHOR ALL-SUT Q-FIX 2.8 (Anchor) ×3 IMPLANT
ANCHOR SUT 5.5 MULTIFIX (Orthopedic Implant) ×2 IMPLANT
ANCHOR SUT 5.5MM MULTIFIX (Orthopedic Implant) ×1 IMPLANT
BUR RADIUS 4.0X18.5 (BURR) ×3 IMPLANT
BUR RADIUS 5.5 (BURR) ×3 IMPLANT
CANNULA 5.75X7 CRYSTAL CLEAR (CANNULA) ×6 IMPLANT
CANNULA PARTIAL THREAD 2X7 (CANNULA) ×3 IMPLANT
CANNULA TWIST IN 8.25X9CM (CANNULA) IMPLANT
CLOSURE WOUND 1/2 X4 (GAUZE/BANDAGES/DRESSINGS) ×2
CONNECTOR PERFECT PASSER (CONNECTOR) IMPLANT
COOLER POLAR GLACIER W/PUMP (MISCELLANEOUS) ×3 IMPLANT
COVER WAND RF STERILE (DRAPES) ×3 IMPLANT
CRADLE LAMINECT ARM (MISCELLANEOUS) ×3 IMPLANT
DEVICE SUCT BLK HOLE OR FLOOR (MISCELLANEOUS) IMPLANT
DRAPE IMP U-DRAPE 54X76 (DRAPES) ×6 IMPLANT
DRAPE INCISE IOBAN 66X45 STRL (DRAPES) ×3 IMPLANT
DRAPE SHEET LG 3/4 BI-LAMINATE (DRAPES) ×3 IMPLANT
DRAPE U-SHAPE 47X51 STRL (DRAPES) IMPLANT
DURAPREP 26ML APPLICATOR (WOUND CARE) ×9 IMPLANT
ELECT REM PT RETURN 9FT ADLT (ELECTROSURGICAL) ×3
ELECTRODE REM PT RTRN 9FT ADLT (ELECTROSURGICAL) ×1 IMPLANT
GAUZE PETRO XEROFOAM 1X8 (MISCELLANEOUS) ×3 IMPLANT
GAUZE SPONGE 4X4 12PLY STRL (GAUZE/BANDAGES/DRESSINGS) ×6 IMPLANT
GLOVE BIOGEL PI IND STRL 9 (GLOVE) ×1 IMPLANT
GLOVE BIOGEL PI INDICATOR 9 (GLOVE) ×2
GLOVE SURG 9.0 ORTHO LTXF (GLOVE) ×6 IMPLANT
GOWN STRL REUS TWL 2XL XL LVL4 (GOWN DISPOSABLE) ×3 IMPLANT
GOWN STRL REUS W/ TWL LRG LVL3 (GOWN DISPOSABLE) ×1 IMPLANT
GOWN STRL REUS W/ TWL LRG LVL4 (GOWN DISPOSABLE) ×1 IMPLANT
GOWN STRL REUS W/TWL LRG LVL3 (GOWN DISPOSABLE) ×2
GOWN STRL REUS W/TWL LRG LVL4 (GOWN DISPOSABLE) ×2
IV LACTATED RINGER IRRG 3000ML (IV SOLUTION) ×12
IV LR IRRIG 3000ML ARTHROMATIC (IV SOLUTION) ×6 IMPLANT
KIT STABILIZATION SHOULDER (MISCELLANEOUS) ×3 IMPLANT
KIT SUTURE 2.8 Q-FIX DISP (MISCELLANEOUS) IMPLANT
KIT SUTURETAK 3.0 INSERT PERC (KITS) IMPLANT
KIT TURNOVER KIT A (KITS) ×3 IMPLANT
MANIFOLD NEPTUNE II (INSTRUMENTS) ×3 IMPLANT
MASK FACE SPIDER DISP (MASK) ×3 IMPLANT
MAT ABSORB  FLUID 56X50 GRAY (MISCELLANEOUS) ×4
MAT ABSORB FLUID 56X50 GRAY (MISCELLANEOUS) ×2 IMPLANT
NDL SAFETY ECLIPSE 18X1.5 (NEEDLE) ×1 IMPLANT
NEEDLE HYPO 18GX1.5 SHARP (NEEDLE) ×2
NEEDLE HYPO 22GX1.5 SAFETY (NEEDLE) ×3 IMPLANT
NS IRRIG 500ML POUR BTL (IV SOLUTION) ×3 IMPLANT
PACK ARTHROSCOPY SHOULDER (MISCELLANEOUS) ×3 IMPLANT
PAD WRAPON POLAR SHDR XLG (MISCELLANEOUS) ×1 IMPLANT
PASSER SUT CAPTURE FIRST (SUTURE) IMPLANT
SET TUBE SUCT SHAVER OUTFL 24K (TUBING) ×3 IMPLANT
SET TUBE TIP INTRA-ARTICULAR (MISCELLANEOUS) ×3 IMPLANT
STRAP SAFETY 5IN WIDE (MISCELLANEOUS) ×3 IMPLANT
STRIP CLOSURE SKIN 1/2X4 (GAUZE/BANDAGES/DRESSINGS) ×4 IMPLANT
SUT ETHILON 4-0 (SUTURE) ×2
SUT ETHILON 4-0 FS2 18XMFL BLK (SUTURE) ×1
SUT LASSO 90 DEG SD STR (SUTURE) IMPLANT
SUT MNCRL 4-0 (SUTURE) ×2
SUT MNCRL 4-0 27XMFL (SUTURE) ×1
SUT PDS AB 0 CT1 27 (SUTURE) ×3 IMPLANT
SUT PERFECTPASSER WHITE CART (SUTURE) IMPLANT
SUT VIC AB 0 CT1 36 (SUTURE) ×3 IMPLANT
SUT VIC AB 2-0 CT2 27 (SUTURE) ×3 IMPLANT
SUTURE ETHLN 4-0 FS2 18XMF BLK (SUTURE) ×1 IMPLANT
SUTURE MAGNUM WIRE 2X48 BLK (SUTURE) IMPLANT
SUTURE MNCRL 4-0 27XMF (SUTURE) ×1 IMPLANT
SYR 10ML LL (SYRINGE) ×3 IMPLANT
TAPE MICROFOAM 4IN (TAPE) ×3 IMPLANT
TUBING ARTHRO INFLOW-ONLY STRL (TUBING) ×3 IMPLANT
TUBING CONNECTING 10 (TUBING) ×2 IMPLANT
TUBING CONNECTING 10' (TUBING) ×1
WAND HAND CNTRL MULTIVAC 90 (MISCELLANEOUS) ×3 IMPLANT
WRAPON POLAR PAD SHDR XLG (MISCELLANEOUS) ×3

## 2018-12-19 NOTE — Anesthesia Post-op Follow-up Note (Signed)
Anesthesia QCDR form completed.        

## 2018-12-19 NOTE — Anesthesia Preprocedure Evaluation (Signed)
Anesthesia Evaluation  Patient identified by MRN, date of birth, ID band Patient awake    Reviewed: Allergy & Precautions, H&P , NPO status , Patient's Chart, lab work & pertinent test results, reviewed documented beta blocker date and time   History of Anesthesia Complications (+) PONV and history of anesthetic complications  Airway Mallampati: II  TM Distance: >3 FB Neck ROM: full    Dental  (+) Teeth Intact   Pulmonary neg pulmonary ROS,    Pulmonary exam normal        Cardiovascular Exercise Tolerance: Poor hypertension, On Medications Normal cardiovascular exam+ Valvular Problems/Murmurs  Rhythm:regular Rate:Normal     Neuro/Psych  Headaches, negative psych ROS   GI/Hepatic Neg liver ROS, GERD  Medicated,  Endo/Other  negative endocrine ROSdiabetes  Renal/GU negative Renal ROS  negative genitourinary   Musculoskeletal   Abdominal   Peds  Hematology negative hematology ROS (+)   Anesthesia Other Findings Past Medical History: No date: Arthralgia No date: Asthma No date: 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 No issues. No date: Hypertension No date: PONV (postoperative nausea and vomiting) No date: Seasonal allergies Past Surgical History: No date: BREAST SURGERY     Comment:  DCIS, lumpectomy 05/26/2015: CATARACT EXTRACTION W/PHACO; Left     Comment:  Procedure: CATARACT EXTRACTION PHACO AND INTRAOCULAR               LENS PLACEMENT (IOC);  Surgeon: Leandrew Koyanagi, MD;               Location: [place];  Service: Ophthalmology;                Laterality: Left;  DIABETIC 06/09/2015: CATARACT EXTRACTION W/PHACO; Right     Comment:  Procedure:  CATARACT EXTRACTION PHACO AND INTRAOCULAR               LENS PLACEMENT (IOC);  Surgeon: Leandrew Koyanagi, MD;               Location: [place];  Service: Ophthalmology;                Laterality: Right;  DIABETIC - oral meds 12/09/2015: COLONOSCOPY WITH PROPOFOL; N/A     Comment:  Procedure: COLONOSCOPY WITH PROPOFOL;  Surgeon: Manya Silvas, MD;  Location: [facility] ENDOSCOPY;  Service:               Endoscopy;  Laterality: N/A; No date: EYE SURGERY No date: HAMMER TOE SURGERY; Right No date: TOE SURGERY; Right     Comment:  corn removed from 5th toe No date: TUBAL LIGATION BMI    Body Mass Index:  37.86 kg/m     Reproductive/Obstetrics negative OB ROS                             Anesthesia Physical Anesthesia Plan  ASA: III  Anesthesia Plan: General ETT   Post-op Pain Management:  Regional for Post-op pain   Induction:   PONV Risk Score and Plan:   Airway Management Planned:   Additional Equipment:   Intra-op Plan:  Post-operative Plan:   Informed Consent: I have reviewed the patients History and Physical, chart, labs and discussed the procedure including the risks, benefits and alternatives for the proposed anesthesia with the patient or authorized representative who has indicated his/her understanding and acceptance.     Dental Advisory Given  Plan Discussed with: CRNA  Anesthesia Plan Comments:         Anesthesia Quick Evaluation

## 2018-12-19 NOTE — Discharge Instructions (Signed)
Interscalene Nerve Block with Exparel  1.  For your surgery you have received an Interscalene Nerve Block with Exparel. 2. Nerve Blocks affect many types of nerves, including nerves that control movement, pain and normal sensation.  You may experience feelings such as numbness, tingling, heaviness, weakness or the inability to move your arm or the feeling or sensation that your arm has "fallen asleep". 3. A nerve block with Exparel can last up to 5 days.  Usually the weakness wears off first.  The tingling and heaviness usually wear off next.  Finally you may start to notice pain.  Keep in mind that this may occur in any order.  Once a nerve block starts to wear off it is usually completely gone within 60 minutes. 4. ISNB may cause mild shortness of breath, a hoarse voice, blurry vision, unequal pupils, or drooping of the face on the same side as the nerve block.  These symptoms will usually resolve with the numbness.  Very rarely the procedure itself can cause mild seizures. 5. If needed, your surgeon will give you a prescription for pain medication.  It will take about 60 minutes for the oral pain medication to become fully effective.  So, it is recommended that you start taking this medication before the nerve block first begins to wear off, or when you first begin to feel discomfort. 6. Take your pain medication only as prescribed.  Pain medication can cause sedation and decrease your breathing if you take more than you need for the level of pain that you have. 7. Nausea is a common side effect of many pain medications.  You may want to eat something before taking your pain medicine to prevent nausea. 8. After an Interscalene nerve block, you cannot feel pain, pressure or extremes in temperature in the effected arm.  Because your arm is numb it is at an increased risk for injury.  To decrease the possibility of injury, please practice the following:  a. While you are awake change the position of  your arm frequently to prevent too much pressure on any one area for prolonged periods of time. b.  If you have a cast or tight dressing, check the color or your fingers every couple of hours.  Call your surgeon with the appearance of any discoloration (white or blue). c. If you are given a sling to wear before you go home, please wear it  at all times until the block has completely worn off.  Do not get up at night without your sling. d. Please contact [facility] or your surgeon if you do not begin to regain sensation after 7 days from the surgery.  Anesthesia may be contacted by calling the Same Day Surgery Department, Mon. through Fri., 6 am to 4 pm at [phone].   e. If you experience any other problems or concerns, please contact your surgeon's office. If you experience severe or prolonged shortness of breath go to the nearest emergency department.    AMBULATORY SURGERY  DISCHARGE INSTRUCTIONS   1) The drugs that you were given will stay in your system until tomorrow so for the next 24 hours you should not:  A) Drive an automobile B) Make any legal decisions C) Drink any alcoholic beverage   2) You may resume regular meals tomorrow.  Today it is better to start with liquids and gradually work up to solid foods.  You may eat anything you prefer, but it is better to start with liquids, then soup  and crackers, and gradually work up to solid foods.   3) Please notify your doctor immediately if you have any unusual bleeding, trouble breathing, redness and pain at the surgery site, drainage, fever, or pain not relieved by medication. 4)   5) Your post-operative visit with Dr.                                     is: Date:                        Time:    Please call to schedule your post-operative visit.  6) Additional Instructions:

## 2018-12-19 NOTE — Anesthesia Procedure Notes (Signed)
Procedure Name: Intubation Date/Time: 12/19/2018 8:05 AM Performed by: Nelda Marseille, CRNA Pre-anesthesia Checklist: Patient identified, Patient being monitored, Timeout performed, Emergency Drugs available and Suction available Patient Re-evaluated:Patient Re-evaluated prior to induction Oxygen Delivery Method: Circle system utilized Preoxygenation: Pre-oxygenation with 100% oxygen Induction Type: IV induction Ventilation: Mask ventilation without difficulty Laryngoscope Size: Mac, 3 and McGraph Grade View: Grade I Tube type: Oral Tube size: 7.0 mm Number of attempts: 1 Airway Equipment and Method: Stylet Placement Confirmation: ETT inserted through vocal cords under direct vision,  positive ETCO2 and breath sounds checked- equal and bilateral Secured at: 21 cm Tube secured with: Tape Dental Injury: Teeth and Oropharynx as per pre-operative assessment

## 2018-12-19 NOTE — Transfer of Care (Signed)
Immediate Anesthesia Transfer of Care Note  Patient: Ruth Harris  Procedure(s) Performed: LEFT SHOULDER ARTHROSCOPY WITH SUBACROMIAL DECOMPRESSION, DISTAL CLAVICLE EXCISION, MINI OPEN ROTATOR CUFF REPAIR (Left )  Patient Location: PACU  Anesthesia Type:General  Level of Consciousness: sedated  Airway & Oxygen Therapy: Patient Spontanous Breathing and Patient connected to face mask oxygen  Post-op Assessment: Report given to RN and Post -op Vital signs reviewed and stable  Post vital signs: Reviewed and stable  Last Vitals:  Vitals Value Taken Time  BP    Temp    Pulse 52 12/19/2018 10:46 AM  Resp 16 12/19/2018 10:46 AM  SpO2 84 % 12/19/2018 10:46 AM  Vitals shown include unvalidated device data.  Last Pain:  Vitals:   12/19/18 0743  TempSrc:   PainSc: 0-No pain         Complications: No apparent anesthesia complications

## 2018-12-19 NOTE — H&P (Signed)
PREOPERATIVE H&P  Chief Complaint: PARTIAL THICKNESS ROTATOR CUFF TEAR - LEFT  HPI: Ruth Harris is a 73 y.o. female who presents for preoperative history and physical with a diagnosis of PARTIAL THICKNESS ROTATOR CUFF TEAR - LEFT SHOULDER. Symptoms are rated as moderate to severe, and have been worsening.  This is significantly impairing activities of daily living.  She has elected for surgical management.   Past Medical History:  Diagnosis Date  . Arthralgia   . Asthma   . DCIS (ductal carcinoma in situ) of breast    right  . Diabetes mellitus without complication ([place])   . GERD (gastroesophageal reflux disease)   . Headache    "from eye strain"  . Heart murmur    Told she had murmur after a surgery. No issues.  . Hypertension   . PONV (postoperative nausea and vomiting)   . Seasonal allergies    Past Surgical History:  Procedure Laterality Date  . BREAST SURGERY     DCIS, lumpectomy  . CATARACT EXTRACTION W/PHACO Left 05/26/2015   Procedure: CATARACT EXTRACTION PHACO AND INTRAOCULAR LENS PLACEMENT (IOC);  Surgeon: Leandrew Koyanagi, MD;  Location: [facility];  Service: Ophthalmology;  Laterality: Left;  DIABETIC  . CATARACT EXTRACTION W/PHACO Right 06/09/2015   Procedure: CATARACT EXTRACTION PHACO AND INTRAOCULAR LENS PLACEMENT ([place]);  Surgeon: Leandrew Koyanagi, MD;  Location: [place];  Service: Ophthalmology;  Laterality: Right;  DIABETIC - oral meds  . COLONOSCOPY WITH PROPOFOL N/A 12/09/2015   Procedure: COLONOSCOPY WITH PROPOFOL;  Surgeon: Manya Silvas, MD;  Location: [facility] ENDOSCOPY;  Service: Endoscopy;  Laterality: N/A;  . EYE SURGERY    . HAMMER TOE SURGERY Right   . TOE SURGERY Right    corn removed from 5th toe  . TUBAL LIGATION     Social History   Socioeconomic History  . Marital status: Widowed    Spouse name: Not on file  . Number of children: Not on file  . Years of education: Not on file  . Highest education level: Not on  file  Occupational History  . Not on file  Social Needs  . Financial resource strain: Not on file  . Food insecurity:    Worry: Not on file    Inability: Not on file  . Transportation needs:    Medical: Not on file    Non-medical: Not on file  Tobacco Use  . Smoking status: Never Smoker  . Smokeless tobacco: Never Used  Substance and Sexual Activity  . Alcohol use: No    Alcohol/week: 0.0 standard drinks  . Drug use: No  . Sexual activity: Not on file  Lifestyle  . Physical activity:    Days per week: Not on file    Minutes per session: Not on file  . Stress: Not on file  Relationships  . Social connections:    Talks on phone: Not on file    Gets together: Not on file    Attends religious service: Not on file    Active member of club or organization: Not on file    Attends meetings of clubs or organizations: Not on file    Relationship status: Not on file  Other Topics Concern  . Not on file  Social History Narrative  . Not on file   History reviewed. No pertinent family history. Allergies  Allergen Reactions  . Shellac Rash  . Trazodone Rash   Prior to Admission medications   Medication Sig Start Date End Date  Taking? Authorizing Provider  atorvastatin (LIPITOR) 20 MG tablet Take 20 mg by mouth daily.  12/11/17  Yes [provider]  diphenhydrAMINE (BENADRYL) 25 MG tablet Take 25 mg by mouth at bedtime.   Yes [provider]  fluticasone (FLONASE) 50 MCG/ACT nasal spray Place 2 sprays into both nostrils daily as needed for allergies or rhinitis.    Yes [provider]  losartan (COZAAR) 50 MG tablet Take 50 mg by mouth daily.  12/10/17  Yes [provider]  metFORMIN (GLUCOPHAGE-XR) 500 MG 24 hr tablet Take 1,500 mg by mouth daily with breakfast.  05/08/17  Yes [provider]  pantoprazole (PROTONIX) 40 MG tablet Take 40 mg by mouth daily as needed (indigestion).  09/10/17 12/10/18 Yes [provider]  torsemide  (DEMADEX) 10 MG tablet Take 10 mg by mouth daily as needed (fluid).  03/09/14  Yes [provider]  traMADol (ULTRAM) 50 MG tablet Take 50 mg by mouth every 6 (six) hours as needed.   Yes [provider]  diclofenac sodium (VOLTAREN) 1 % GEL Apply 4 g topically 4 (four) times daily. Patient taking differently: Apply 4 g topically 4 (four) times daily as needed.  03/15/16   Hyatt, Max T, DPM     Positive ROS: All other systems have been reviewed and were otherwise negative with the exception of those mentioned in the HPI and as above.  Physical Exam: General: Alert, no acute distress Cardiovascular: Regular rate and rhythm, no murmurs rubs or gallops.  No pedal edema Respiratory: Clear to auscultation bilaterally, no wheezes rales or rhonchi. No cyanosis, no use of accessory musculature GI: No organomegaly, abdomen is soft and non-tender nondistended with positive bowel sounds. Skin: Skin intact, no lesions within the operative field. Neurologic: Sensation intact distally Psychiatric: Patient is competent for consent with normal mood and affect Lymphatic: No cervical lymphadenopathy  MUSCULOSKELETAL: Left shoulder:   Pain with 90 degrees of forward elevation and abduction.  + impingement, NVI, no instability  Assessment: PARTIAL THICKNESS ROTATOR CUFF TEAR - LEFT SHOULDER  Plan: Plan for Procedure(s): SHOULDER ARTHROSCOPY WITH ROTATOR CUFF REPAIR, LEFT SHOULDER  I have reviewed the details of the operation and the post-op course with the patient  I discussed the risks and benefits of surgery. The risks include but are not limited to infection, bleeding requiring blood transfusion, nerve or blood vessel injury, joint stiffness or loss of motion, persistent pain, weakness or instability, malunion, nonunion and hardware failure and the need for further surgery. Medical risks include but are not limited to DVT and pulmonary embolism, myocardial infarction, stroke, pneumonia,  respiratory failure and death. Patient understood these risks and wished to proceed.    Thornton Park, MD   12/19/2018 7:51 AM

## 2018-12-19 NOTE — Op Note (Signed)
12/19/2018  10:53 AM  PATIENT:  Ruth Harris  73 y.o. female  PRE-OPERATIVE DIAGNOSIS:  PARTIAL THICKNESS ROTATOR CUFF TEAR - LEFT SHOULDER  POST-OPERATIVE DIAGNOSIS:  left partial thickness rotator cuff tear  PROCEDURE:  Procedure(s): LEFT SHOULDER ARTHROSCOPY WITH SUBACROMIAL DECOMPRESSION, DISTAL CLAVICLE EXCISION, AND MINI OPEN ROTATOR CUFF REPAIR  SURGEON:  Surgeon(s) and Role:    Thornton Park, MD - Primary  ANESTHESIA:   general and paracervical block   PREOPERATIVE INDICATIONS:  Ruth Harris is a  73 y.o. female with a diagnosis of PARTIAL THICKNESS ROTATOR CUFF TEAR - LEFT failed conservative treatment and elected for surgical management.    The risks benefits and alternatives were discussed with the patient preoperatively including but not limited to the risks of infection, bleeding, nerve injury, persistent pain or weakness, failure of the hardware, re-tear of the rotator cuff and the need for further surgery. Medical risks include DVT and pulmonary embolism, myocardial infarction, stroke, pneumonia, respiratory failure and death. Patient understood these risks and wished to proceed.  OPERATIVE IMPLANTS: Piru Multifix anchor x 1 & [employer] Q Fix anchors x 1  OPERATIVE FINDINGS: High grade partial thickness tear of the supraspinatus with large interstitial component.  Biceps tendon intact.  No labral tear.    OPERATIVE PROCEDURE: The patient was met in the preoperative area. The left shoulder was signed with the word yes and my initials according the hospital's correct site of surgery protocol.   History and physical was performed at the bedside.   Patient underwent an interscalene block by the anesthesia service.  Patient was brought to the operating room where she underwent general anesthesia.  The patient was placed in a beachchair position.  A spider arm positioner was used for this case. Examination under anesthesia revealed no limitation of motion  or instability with load shift testing. The patient had a negative sulcus sign.  Patient was prepped and draped in a sterile fashion. A timeout was performed to verify the patient's name, date of birth, medical record number, correct site of surgery and correct procedure to be performed there was also used to verify the patient received antibiotics that all appropriate instruments, implants and radiographs studies were available in the room. Once all in attendance were in agreement case began.  Bony landmarks were drawn out with a surgical marker along with proposed arthroscopy incisions. These were pre-injected with 1% lidocaine plain. An 11 blade was used to establish a posterior portal through which the arthroscope was placed in the glenohumeral joint. A full diagnostic examination of the shoulder was performed.  Patient was found to have high-grade partial-thickness tear. An 18-gauge spinal needle was used to place a 0 PDS suture through the rotator cuff tear for identification from the bursal side.  The arthroscope was then placed in the subacromial space.   Extensive bursitis was encountered and debrided using a 4-0 resector shaver blade and a 90 ArthroCare wand from a lateral portal which was established under direct visualization using an 18-gauge spinal needle. 0 PDS suture was identified.  A subacromial decompression was also performed using a 5.5 mm resector shaver blade from the lateral portal. The partial-thickness tear was then completed with a 4.0 resector shaver blade.  Two Perfect Pass suture were placed in the lateral border of the rotator cuff tear. All arthroscopic instruments were then removed and the mini-open portion of the procedure began.  A saber-type incision was made along the lateral border of the  acromion. The deltoid muscle was identified and split in line with its fibers which allowed visualization of the rotator cuff. The Perfect Pass sutures previously placed in the lateral  border of the rotator cuff were brought out through the deltoid split.  A 5.5 mm resector shaver blade was then used to debride the greater tuberosity of all torn fibers of the rotator cuff.  A single [employer] anchor was placed at the articular margin of the humeral head with the greater tuberosity. The suture limbs of the Q Fix anchor were passed medially through the rotator cuff using a First Pass suture passer.   The two Perfect Pass sutures were then anchored to the greater tuberosity footprint using a [employer] Multifix anchor. The sutures were tensioned to allow for anatomic reduction of the rotator cuff to the greater tuberosity. The medial row sutures were then tied down using an arthroscopic knot tying technique.  Arthroscopic images of the repair were taken with the arthroscope both externally and from inside the glenohumeral joint.  All incisions were copiously irrigated. The deltoid fascia was repaired using a 0 Vicryl suture.  The subcutaneous tissue of all incisions were closed with a 2-0 Vicryl. Skin closure for the arthroscopic incisions was performed with 4-0 nylon. The skin edges of the saber incision was approximated with a running 4-0 undyed Monocryl.   A dry sterile dressing was applied.  The patient was placed in an abduction sling and a Polar Care was applied to the shoulder.  All sharp and it instrument counts were correct at the conclusion of the case. I was scrubbed and present for the entire case. I spoke with the patient's son postoperatively to let him know the case had been performed without complication and the patient was stable in recovery room.

## 2018-12-20 NOTE — Anesthesia Postprocedure Evaluation (Signed)
Anesthesia Post Note  Patient: Mckenzye Cutright Reffner  Procedure(s) Performed: LEFT SHOULDER ARTHROSCOPY WITH SUBACROMIAL DECOMPRESSION, DISTAL CLAVICLE EXCISION, MINI OPEN ROTATOR CUFF REPAIR (Left )  Patient location during evaluation: PACU Anesthesia Type: General Level of consciousness: awake and alert Pain management: pain level controlled Vital Signs Assessment: post-procedure vital signs reviewed and stable Respiratory status: spontaneous breathing, nonlabored ventilation, respiratory function stable and patient connected to nasal cannula oxygen Cardiovascular status: blood pressure returned to baseline and stable Postop Assessment: no apparent nausea or vomiting Anesthetic complications: no     Last Vitals:  Vitals:   12/19/18 1147 12/19/18 1245  BP: 121/61 118/74  Pulse: 89 80  Resp: 16 16  Temp: 36.7 C   SpO2: 94% 95%    Last Pain:  Vitals:   12/19/18 1245  TempSrc:   PainSc: 3                  Molli Barrows

## 2019-06-09 IMAGING — MR MR SHOULDER*L* W/O CM
5 series · 40 of 40 positions shown · non-contrast
Comparison: None.

CLINICAL DATA: Chronic left shoulder pain with limited range of
motion.

EXAM:
MRI OF THE LEFT SHOULDER WITHOUT CONTRAST
TECHNIQUE: Multiplanar, multisequence MR imaging of the shoulder was performed.
No intravenous contrast was administered.

[Series 3: T2 fat-sat · axial · 4.0mm · 0.59mm/px · z∈[-22,+62]mm · 8 of 20 slices shown (1 of 3)]
[im 1/20]
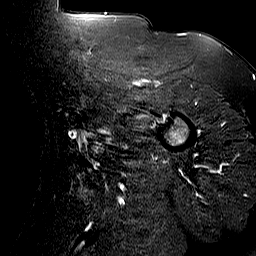
[im 3/20]
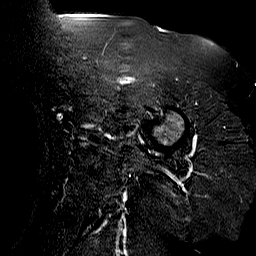
[im 6/20]
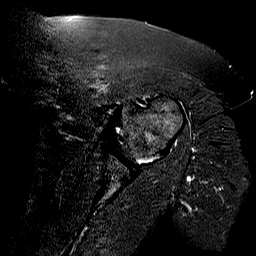
[im 9/20]
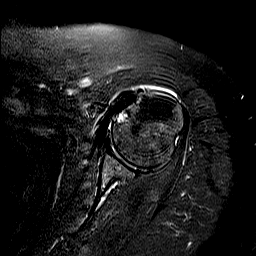
[im 11/20]
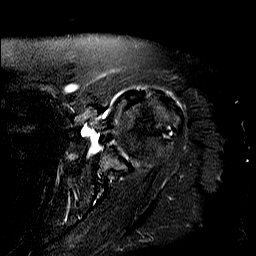
[im 14/20]
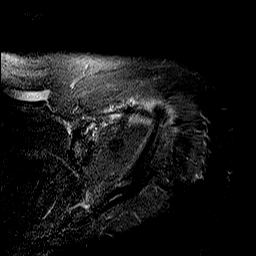
[im 17/20]
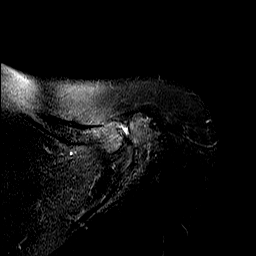
[im 20/20]
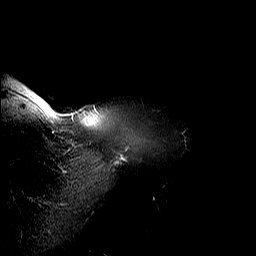

[Series 4: T2 fat-sat · oblique · 4.0mm · 0.59mm/px · 7 of 17 slices shown (2 of 3)]
[im 1/17]
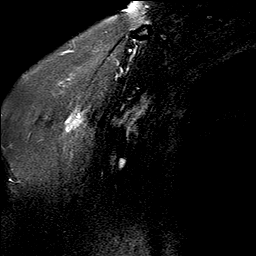
[im 3/17]
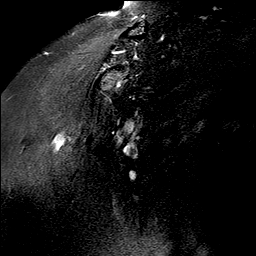
[im 6/17]
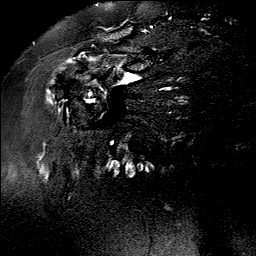
[im 9/17]
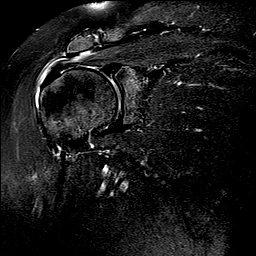
[im 11/17]
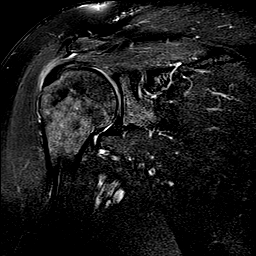
[im 14/17]
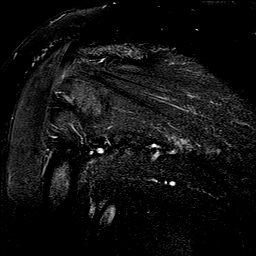
[im 17/17]
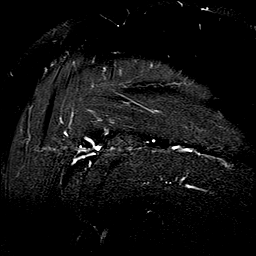

[Series 5: PD · oblique · 4.0mm · 0.59mm/px · 7 of 17 slices shown]
[im 1/17]
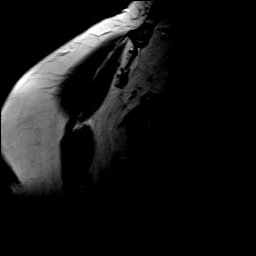
[im 3/17]
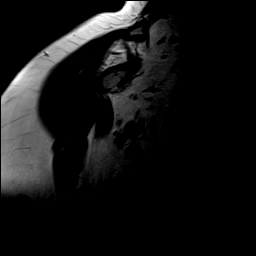
[im 6/17]
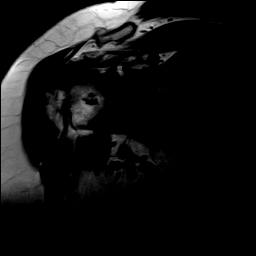
[im 9/17]
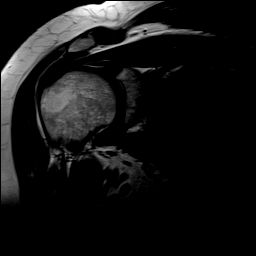
[im 11/17]
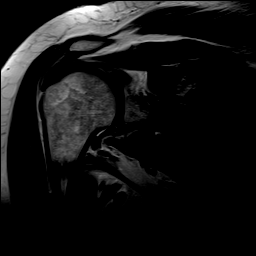
[im 14/17]
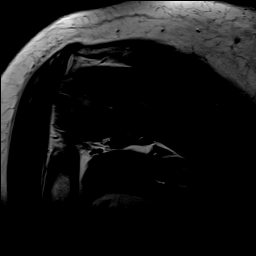
[im 17/17]
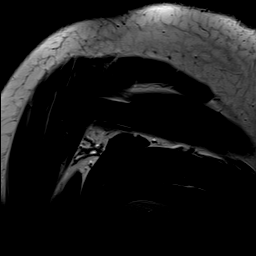

[Series 6: T1 · oblique · 4.0mm · 0.59mm/px · 9 of 20 slices shown]
[im 1/20]
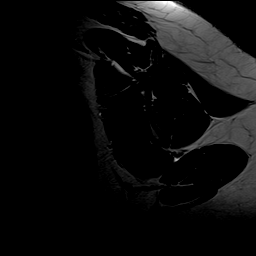
[im 3/20]
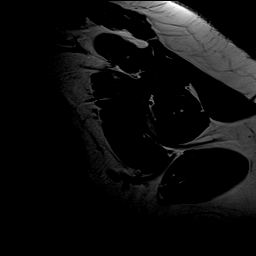
[im 5/20]
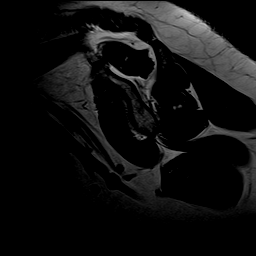
[im 8/20]
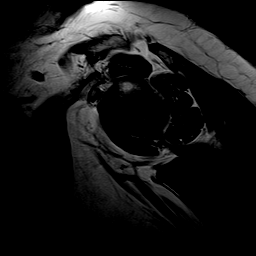
[im 10/20]
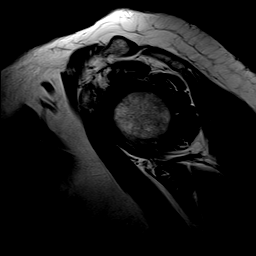
[im 12/20]
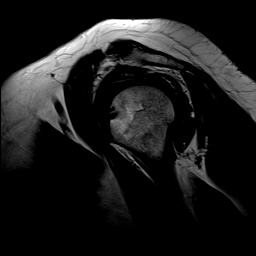
[im 15/20]
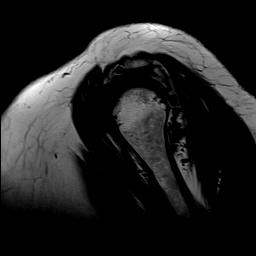
[im 17/20]
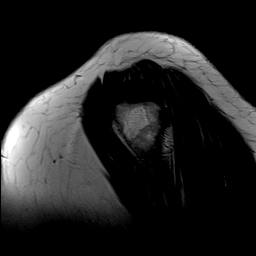
[im 20/20]
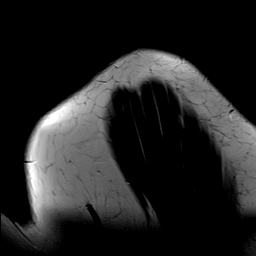

[Series 7: T2 fat-sat · oblique · 4.0mm · 0.59mm/px · 9 of 20 slices shown (3 of 3)]
[im 1/20]
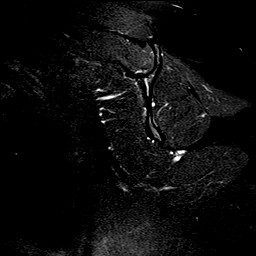
[im 3/20]
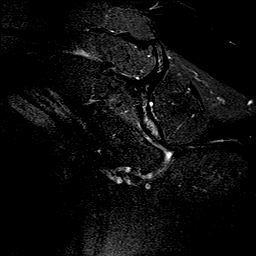
[im 5/20]
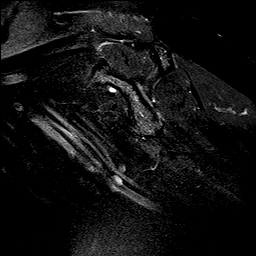
[im 8/20]
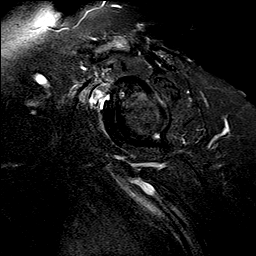
[im 10/20]
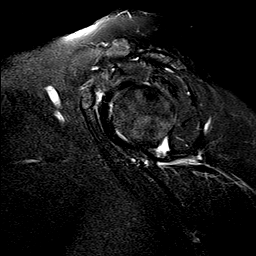
[im 12/20]
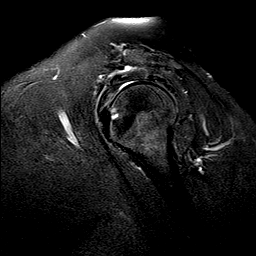
[im 15/20]
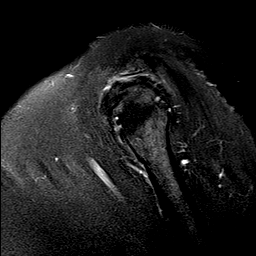
[im 17/20]
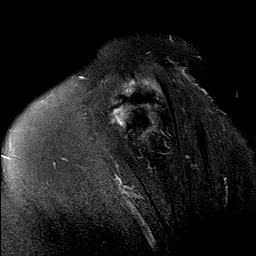
[im 20/20]
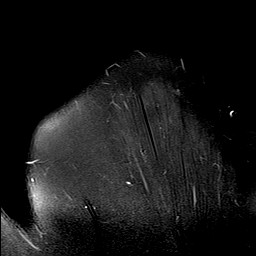

[40 of 40 positions shown; findings below may reference images not displayed]

FINDINGS: Rotator cuff: There is minimal fraying of the bursal surface of the
anterior aspect of the distal supraspinatus tendon. No rotator cuff
tear.

Muscles: No atrophy or abnormal signal of the muscles of the rotator
cuff.

Biceps long head:  Properly located and intact.

Acromioclavicular Joint: Normal. Type 1 acromion. Slight subdeltoid
bursitis.

Glenohumeral Joint: No joint effusion. No chondral defect.

Labrum:  Normal.

Bones:  No marrow abnormality, fracture or dislocation.

Other: None
IMPRESSION: 1. Minimal subdeltoid bursitis.
2. Minimal fraying of the bursal surface of the distal supraspinatus
tendon.

## 2020-01-10 IMAGING — RF DG FLUORO GUIDE NDL PLC/BX
1 series · 1 of 1 positions shown · non-contrast
Comparison: none

CLINICAL DATA: Chronic left shoulder pain

[Series 1: cp_standard · 0.26mm/px · 1 of 1 slices shown]
[im 1/1]
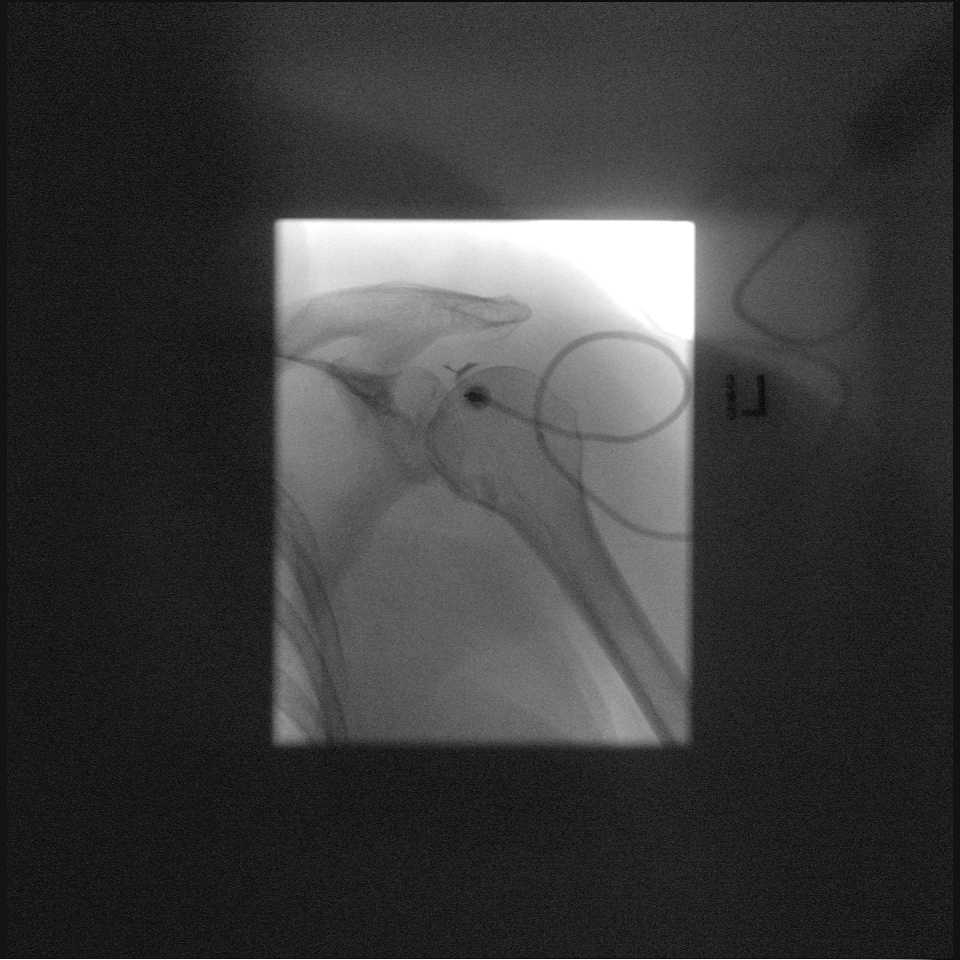

[1 of 1 positions shown; findings below may reference images not displayed]

EXAM:
LEFT SHOULDER ARTHROGRAM UNDER FLUOROSCOPY

FLUOROSCOPY TIME:  Fluoroscopy Time:  0.1 minute

Radiation Exposure Index (if provided by the fluoroscopic device):
0.2 mGy

Number of Acquired Spot Images: 0

PROCEDURE:
The risks and benefits of the procedure were discussed with the
patient, and written informed consent was obtained. The patient
stated no history of allergy to contrast media. A formal timeout
procedure was performed with the patient according to departmental
protocol.

The patient was placed supine on the fluoroscopy table and the left
glenohumeral joint was identified under fluoroscopy. The skin
overlying the left glenohumeral joint was subsequently cleaned with
Chloraprep and a sterile drape was placed over the area of interest.
5 ml 1% Lidocaine was used to anesthetize the skin around the needle
insertion site.

A 22 gauge spinal needle was inserted into the left glenohumeral
joint under fluoroscopy. Position was confirmed with injection of
less than 1ml of Ssovue-6HH under fluoroscopy.

12 ml of gadolinium mixture (0.1 ml of Multihance mixed with 13 ml
of sterile saline and 7 mL Ssovue-6HH) was injected into the left
glenohumeral joint.

The needle was removed and hemostasis was achieved. The patient was
subsequently transferred to MRI for imaging.
IMPRESSION: Technically successful left shoulder arthrogram under fluoroscopy
prior to MRI.

## 2020-05-31 DIAGNOSIS — E538 Deficiency of other specified B group vitamins: Secondary | ICD-10-CM | POA: Insufficient documentation

## 2021-06-01 ENCOUNTER — Other Ambulatory Visit: Payer: Self-pay | Admitting: Physical Medicine & Rehabilitation

## 2021-06-01 DIAGNOSIS — G8929 Other chronic pain: Secondary | ICD-10-CM

## 2021-06-01 DIAGNOSIS — M5441 Lumbago with sciatica, right side: Secondary | ICD-10-CM

## 2021-06-09 ENCOUNTER — Ambulatory Visit
Admission: RE | Admit: 2021-06-09 | Discharge: 2021-06-09 | Disposition: A | Discharge status: Home / Self Care | Payer: Medicare HMO | Source: Ambulatory Visit | Attending: Physical Medicine & Rehabilitation | Admitting: Physical Medicine & Rehabilitation

## 2021-06-09 DIAGNOSIS — G8929 Other chronic pain: Secondary | ICD-10-CM | POA: Insufficient documentation

## 2021-06-09 DIAGNOSIS — M5441 Lumbago with sciatica, right side: Secondary | ICD-10-CM | POA: Insufficient documentation

## 2021-11-01 ENCOUNTER — Other Ambulatory Visit: Payer: Self-pay | Admitting: Internal Medicine

## 2021-11-01 DIAGNOSIS — Z1231 Encounter for screening mammogram for malignant neoplasm of breast: Secondary | ICD-10-CM

## 2022-03-08 ENCOUNTER — Other Ambulatory Visit
Admission: RE | Admit: 2022-03-08 | Discharge: 2022-03-08 | Disposition: A | Discharge status: Home / Self Care | Payer: Medicare HMO | Source: Ambulatory Visit | Attending: Internal Medicine | Admitting: Internal Medicine

## 2022-03-08 DIAGNOSIS — R0602 Shortness of breath: Secondary | ICD-10-CM | POA: Diagnosis present

## 2022-03-08 LAB — D-DIMER, QUANTITATIVE: D-Dimer, Quant: 0.29 ug/mL-FEU (ref 0.00–0.50)

## 2022-05-09 ENCOUNTER — Other Ambulatory Visit (HOSPITAL_COMMUNITY): Payer: Self-pay | Admitting: Physical Medicine & Rehabilitation

## 2022-05-09 ENCOUNTER — Other Ambulatory Visit: Payer: Self-pay | Admitting: Physical Medicine & Rehabilitation

## 2022-05-09 DIAGNOSIS — G8929 Other chronic pain: Secondary | ICD-10-CM

## 2022-05-09 DIAGNOSIS — M48062 Spinal stenosis, lumbar region with neurogenic claudication: Secondary | ICD-10-CM

## 2022-05-17 ENCOUNTER — Ambulatory Visit
Admission: RE | Admit: 2022-05-17 | Discharge: 2022-05-17 | Disposition: A | Discharge status: Home / Self Care | Payer: Medicare HMO | Source: Ambulatory Visit | Attending: Physical Medicine & Rehabilitation | Admitting: Physical Medicine & Rehabilitation

## 2022-05-17 DIAGNOSIS — M5441 Lumbago with sciatica, right side: Secondary | ICD-10-CM | POA: Diagnosis present

## 2022-05-17 DIAGNOSIS — M48062 Spinal stenosis, lumbar region with neurogenic claudication: Secondary | ICD-10-CM

## 2022-05-17 DIAGNOSIS — G8929 Other chronic pain: Secondary | ICD-10-CM | POA: Diagnosis present

## 2022-05-31 ENCOUNTER — Other Ambulatory Visit: Payer: Self-pay | Admitting: Neurosurgery

## 2022-06-29 ENCOUNTER — Other Ambulatory Visit (HOSPITAL_COMMUNITY): Payer: Medicare HMO

## 2022-07-04 NOTE — Progress Notes (Signed)
Surgical Instructions    Your procedure is scheduled on Friday, 07/07/22.  Report to [facility] Main Entrance "A" at 7:30 A.M., then check in with the Admitting office.  Call this number if you have problems the morning of surgery:  [phone]   If you have any questions prior to your surgery date call [phone]: Open Monday-Friday 8am-4pm    Remember:  Do not eat or drink after midnight the night before your surgery      Take these medicines the morning of surgery with A SIP OF WATER:  acetaminophen (TYLENOL) atorvastatin (LIPITOR)  fluticasone (FLONASE) nasal spray  As of today, STOP taking any Aspirin (unless otherwise instructed by your surgeon) Aleve, Naproxen, Ibuprofen, Motrin, Advil, Goody's, BC's, all herbal medications, fish oil, and all vitamins.  WHAT DO I DO ABOUT MY DIABETES MEDICATION?   Do not take oral diabetes medicines (pills) the morning of surgery.  Hold JARDIANCE 72 hours prior to surgery.  The day of surgery, do not take other diabetes injectables, including Byetta (exenatide), Bydureon (exenatide ER), Victoza (liraglutide), or Trulicity (dulaglutide).  If your CBG is greater than 220 mg/dL, you may take  of your sliding scale (correction) dose of insulin.   HOW TO MANAGE YOUR DIABETES BEFORE AND AFTER SURGERY  Why is it important to control my blood sugar before and after surgery? Improving blood sugar levels before and after surgery helps healing and can limit problems. A way of improving blood sugar control is eating a healthy diet by:  Eating less sugar and carbohydrates  Increasing activity/exercise  Talking with your doctor about reaching your blood sugar goals High blood sugars (greater than 180 mg/dL) can raise your risk of infections and slow your recovery, so you will need to focus on controlling your diabetes during the weeks before surgery. Make sure that the doctor who takes care of your diabetes knows about your planned surgery  including the date and location.  How do I manage my blood sugar before surgery? Check your blood sugar at least 4 times a day, starting 2 days before surgery, to make sure that the level is not too high or low.  Check your blood sugar the morning of your surgery when you wake up and every 2 hours until you get to the Short Stay unit.  If your blood sugar is less than 70 mg/dL, you will need to treat for low blood sugar: Do not take insulin. Treat a low blood sugar (less than 70 mg/dL) with  cup of clear juice (cranberry or apple), 4 glucose tablets, OR glucose gel. Recheck blood sugar in 15 minutes after treatment (to make sure it is greater than 70 mg/dL). If your blood sugar is not greater than 70 mg/dL on recheck, call [phone] for further instructions. Report your blood sugar to the short stay nurse when you get to Short Stay.  If you are admitted to the hospital after surgery: Your blood sugar will be checked by the staff and you will probably be given insulin after surgery (instead of oral diabetes medicines) to make sure you have good blood sugar levels. The goal for blood sugar control after surgery is 80-180 mg/dL.  Do not wear jewelry or makeup. Do not wear lotions, powders, perfumes/cologne or deodorant. Do not shave 48 hours prior to surgery.   Do not bring valuables to the hospital. Do not wear nail polish, gel polish, artificial nails, or any other type of covering on natural nails (fingers and toes) If you  have artificial nails or gel coating that need to be removed by a nail salon, please have this removed prior to surgery. Artificial nails or gel coating may interfere with anesthesia's ability to adequately monitor your vital signs.  [facility] is not responsible for any belongings or valuables.    Do NOT Smoke (Tobacco/Vaping)  24 hours prior to your procedure  If you use a CPAP at night, you may bring your mask for your overnight stay.   Contacts, glasses,  hearing aids, dentures or partials may not be worn into surgery, please bring cases for these belongings   For patients admitted to the hospital, discharge time will be determined by your treatment team.   Patients discharged the day of surgery will not be allowed to drive home, and someone needs to stay with them for 24 hours.   SURGICAL WAITING ROOM VISITATION Patients having surgery or a procedure may have no more than 2 support people in the waiting area - these visitors may rotate.   Children under the age of 20 must have an adult with them who is not the patient. If the patient needs to stay at the hospital during part of their recovery, the visitor guidelines for inpatient rooms apply. Pre-op nurse will coordinate an appropriate time for 1 support person to accompany patient in pre-op.  This support person may not rotate.   Please refer to the [facility] website for the visitor guidelines for Inpatients (after your surgery is over and you are in a regular room).    Special instructions:    Oral Hygiene is also important to reduce your risk of infection.  Remember - BRUSH YOUR TEETH THE MORNING OF SURGERY WITH YOUR REGULAR TOOTHPASTE   [facility]- Preparing For Surgery  Before surgery, you can play an important role. Because skin is not sterile, your skin needs to be as free of germs as possible. You can reduce the number of germs on your skin by washing with CHG (chlorahexidine gluconate) Soap before surgery.  CHG is an antiseptic cleaner which kills germs and bonds with the skin to continue killing germs even after washing.     Please do not use if you have an allergy to CHG or antibacterial soaps. If your skin becomes reddened/irritated stop using the CHG.  Do not shave (including legs and underarms) for at least 48 hours prior to first CHG shower. It is OK to shave your face.  Please follow these instructions carefully.     Shower the NIGHT BEFORE SURGERY and the MORNING OF  SURGERY with CHG Soap.   If you chose to wash your hair, wash your hair first as usual with your normal shampoo. After you shampoo, rinse your hair and body thoroughly to remove the shampoo.  Then [employer] and genitals (private parts) with your normal soap and rinse thoroughly to remove soap.  After that Use CHG Soap as you would any other liquid soap. You can apply CHG directly to the skin and wash gently with a scrungie or a clean washcloth.   Apply the CHG Soap to your body ONLY FROM THE NECK DOWN.  Do not use on open wounds or open sores. Avoid contact with your eyes, ears, mouth and genitals (private parts). Wash Face and genitals (private parts)  with your normal soap.   Wash thoroughly, paying special attention to the area where your surgery will be performed.  Thoroughly rinse your body with warm water from the neck down.  DO  NOT shower/wash with your normal soap after using and rinsing off the CHG Soap.  Pat yourself dry with a CLEAN TOWEL.  Wear CLEAN PAJAMAS to bed the night before surgery  Place CLEAN SHEETS on your bed the night before your surgery  DO NOT SLEEP WITH PETS.   Day of Surgery: Take a shower with CHG soap. Wear Clean/Comfortable clothing the morning of surgery Do not apply any deodorants/lotions.   Remember to brush your teeth WITH YOUR REGULAR TOOTHPASTE.    If you received a COVID test during your pre-op visit, it is requested that you wear a mask when out in public, stay away from anyone that may not be feeling well, and notify your surgeon if you develop symptoms. If you have been in contact with anyone that has tested positive in the last 10 days, please notify your surgeon.    Please read over the following fact sheets that you were given.

## 2022-07-05 ENCOUNTER — Encounter (HOSPITAL_COMMUNITY): Payer: Self-pay

## 2022-07-05 ENCOUNTER — Encounter (HOSPITAL_COMMUNITY)
Admission: RE | Admit: 2022-07-05 | Discharge: 2022-07-05 | Disposition: A | Discharge status: Home / Self Care | Payer: Medicare HMO | Source: Ambulatory Visit | Attending: Neurosurgery | Admitting: Neurosurgery

## 2022-07-05 ENCOUNTER — Other Ambulatory Visit: Payer: Self-pay

## 2022-07-05 VITALS — BP 117/49 | HR 72 | Temp 97.7°F | Resp 17 | Ht 62.0 in | Wt 198.3 lb

## 2022-07-05 DIAGNOSIS — E119 Type 2 diabetes mellitus without complications: Secondary | ICD-10-CM | POA: Diagnosis not present

## 2022-07-05 DIAGNOSIS — Z01818 Encounter for other preprocedural examination: Secondary | ICD-10-CM | POA: Insufficient documentation

## 2022-07-05 LAB — SURGICAL PCR SCREEN
MRSA, PCR: NEGATIVE
Staphylococcus aureus: NEGATIVE

## 2022-07-05 LAB — CBC
HCT: 39.9 % (ref 36.0–46.0)
Hemoglobin: 12.7 g/dL (ref 12.0–15.0)
MCH: 30 pg (ref 26.0–34.0)
MCHC: 31.8 g/dL (ref 30.0–36.0)
MCV: 94.1 fL (ref 80.0–100.0)
Platelets: 286 10*3/uL (ref 150–400)
RBC: 4.24 MIL/uL (ref 3.87–5.11)
RDW: 12.3 % (ref 11.5–15.5)
WBC: 8.8 10*3/uL (ref 4.0–10.5)
nRBC: 0 % (ref 0.0–0.2)

## 2022-07-05 LAB — GLUCOSE, CAPILLARY: Glucose-Capillary: 113 mg/dL — ABNORMAL HIGH (ref 70–99)

## 2022-07-05 LAB — HEMOGLOBIN A1C
Hgb A1c MFr Bld: 6.7 % — ABNORMAL HIGH (ref 4.8–5.6)
Mean Plasma Glucose: 145.59 mg/dL

## 2022-07-05 NOTE — Progress Notes (Signed)
Notified Dr. Saintclair Halsted and Shelby Dubin. Of last jardiance dose (today). Recommended hold time is 72 hours and surgery is Friday.

## 2022-07-05 NOTE — Progress Notes (Signed)
PCP - Frazier Richards Cardiologist - denies  PPM/ICD - denies   Chest x-ray - n/a EKG - 9/37/23 Stress Test - denies ECHO - 06/24/21-CE Cardiac Cath - denies  Sleep Study - denies    Does not check CBG at home  Follow your surgeon's instructions on when to stop Aspirin.  If no instructions were given by your surgeon then you will need to call the office to get those instructions.     ERAS Protcol -no   COVID TEST- not needed   Anesthesia review: no  Patient denies shortness of breath, fever, cough and chest pain at PAT appointment   All instructions explained to the patient, with a verbal understanding of the material. Patient agrees to go over the instructions while at home for a better understanding. Patient also instructed to self quarantine after being tested for COVID-19. The opportunity to ask questions was provided.

## 2022-07-07 ENCOUNTER — Encounter (HOSPITAL_COMMUNITY): Payer: Self-pay | Admitting: Neurosurgery

## 2022-07-07 ENCOUNTER — Ambulatory Visit (HOSPITAL_COMMUNITY)
Admission: RE | Admit: 2022-07-07 | Discharge: 2022-07-08 | Disposition: A | Discharge status: Home / Self Care | Payer: Medicare HMO | Attending: Neurosurgery | Admitting: Neurosurgery

## 2022-07-07 ENCOUNTER — Other Ambulatory Visit: Payer: Self-pay

## 2022-07-07 ENCOUNTER — Encounter (HOSPITAL_COMMUNITY)
Admission: RE | Disposition: A | Discharge status: Home / Self Care | Payer: Self-pay | Source: Home / Self Care | Attending: Neurosurgery

## 2022-07-07 ENCOUNTER — Ambulatory Visit (HOSPITAL_BASED_OUTPATIENT_CLINIC_OR_DEPARTMENT_OTHER): Payer: Medicare HMO | Admitting: Physician Assistant

## 2022-07-07 ENCOUNTER — Ambulatory Visit (HOSPITAL_COMMUNITY): Payer: Medicare HMO | Admitting: Physician Assistant

## 2022-07-07 ENCOUNTER — Ambulatory Visit (HOSPITAL_COMMUNITY): Payer: Medicare HMO

## 2022-07-07 DIAGNOSIS — I10 Essential (primary) hypertension: Secondary | ICD-10-CM | POA: Insufficient documentation

## 2022-07-07 DIAGNOSIS — R519 Headache, unspecified: Secondary | ICD-10-CM | POA: Diagnosis not present

## 2022-07-07 DIAGNOSIS — M48062 Spinal stenosis, lumbar region with neurogenic claudication: Secondary | ICD-10-CM

## 2022-07-07 DIAGNOSIS — J45909 Unspecified asthma, uncomplicated: Secondary | ICD-10-CM | POA: Diagnosis not present

## 2022-07-07 DIAGNOSIS — M5416 Radiculopathy, lumbar region: Secondary | ICD-10-CM | POA: Diagnosis not present

## 2022-07-07 DIAGNOSIS — K219 Gastro-esophageal reflux disease without esophagitis: Secondary | ICD-10-CM | POA: Diagnosis not present

## 2022-07-07 DIAGNOSIS — E119 Type 2 diabetes mellitus without complications: Secondary | ICD-10-CM | POA: Diagnosis not present

## 2022-07-07 DIAGNOSIS — Z7984 Long term (current) use of oral hypoglycemic drugs: Secondary | ICD-10-CM | POA: Insufficient documentation

## 2022-07-07 DIAGNOSIS — M48061 Spinal stenosis, lumbar region without neurogenic claudication: Secondary | ICD-10-CM | POA: Diagnosis present

## 2022-07-07 HISTORY — PX: LUMBAR LAMINECTOMY/DECOMPRESSION MICRODISCECTOMY: SHX5026

## 2022-07-07 LAB — BASIC METABOLIC PANEL
Anion gap: 8 (ref 5–15)
BUN: 26 mg/dL — ABNORMAL HIGH (ref 8–23)
CO2: 25 mmol/L (ref 22–32)
Calcium: 9.5 mg/dL (ref 8.9–10.3)
Chloride: 106 mmol/L (ref 98–111)
Creatinine, Ser: 1.18 mg/dL — ABNORMAL HIGH (ref 0.44–1.00)
GFR, Estimated: 48 mL/min — ABNORMAL LOW (ref >60)
Glucose, Bld: 129 mg/dL — ABNORMAL HIGH (ref 70–99)
Potassium: 4 mmol/L (ref 3.5–5.1)
Sodium: 139 mmol/L (ref 135–145)

## 2022-07-07 LAB — GLUCOSE, CAPILLARY
Glucose-Capillary: 140 mg/dL — ABNORMAL HIGH (ref 70–99)
Glucose-Capillary: 118 mg/dL — ABNORMAL HIGH (ref 70–99)
Glucose-Capillary: 132 mg/dL — ABNORMAL HIGH (ref 70–99)
Glucose-Capillary: 230 mg/dL — ABNORMAL HIGH (ref 70–99)
Glucose-Capillary: 140 mg/dL — ABNORMAL HIGH (ref 70–99)

## 2022-07-07 SURGERY — LUMBAR LAMINECTOMY/DECOMPRESSION MICRODISCECTOMY 2 LEVELS
Anesthesia: General | Site: Back | Laterality: Bilateral

## 2022-07-07 MED ORDER — ATORVASTATIN CALCIUM 10 MG PO TABS
20.0000 mg | ORAL_TABLET | Freq: Every morning | ORAL | Status: DC
  Administered 2022-07-08: 20 mg via ORAL
  Filled 2022-07-07: qty 2

## 2022-07-07 MED ORDER — INSULIN ASPART 100 UNIT/ML IJ SOLN
0.0000 [IU] | Freq: Three times a day (TID) | INTRAMUSCULAR | Status: DC
  Administered 2022-07-07: 5 [IU] via SUBCUTANEOUS

## 2022-07-07 MED ORDER — ONDANSETRON HCL 4 MG/2ML IJ SOLN
INTRAMUSCULAR | Status: DC | PRN
  Administered 2022-07-07: 4 mg via INTRAVENOUS

## 2022-07-07 MED ORDER — LIDOCAINE-EPINEPHRINE 1 %-1:100000 IJ SOLN
INTRAMUSCULAR | Status: AC
  Filled 2022-07-07: qty 1

## 2022-07-07 MED ORDER — DEXAMETHASONE SODIUM PHOSPHATE 10 MG/ML IJ SOLN
INTRAMUSCULAR | Status: AC
  Filled 2022-07-07: qty 1

## 2022-07-07 MED ORDER — CYCLOBENZAPRINE HCL 10 MG PO TABS
10.0000 mg | ORAL_TABLET | Freq: Three times a day (TID) | ORAL | Status: DC | PRN
  Administered 2022-07-07 – 2022-07-08 (×2): 10 mg via ORAL
  Filled 2022-07-07 (×2): qty 1

## 2022-07-07 MED ORDER — HYDROMORPHONE HCL 1 MG/ML IJ SOLN: 0.5000 mg | INTRAMUSCULAR | Status: DC | PRN

## 2022-07-07 MED ORDER — METFORMIN HCL ER 500 MG PO TB24
1500.0000 mg | ORAL_TABLET | Freq: Every evening | ORAL | Status: DC
  Administered 2022-07-07: 1500 mg via ORAL
  Filled 2022-07-07: qty 3

## 2022-07-07 MED ORDER — DEXAMETHASONE SODIUM PHOSPHATE 10 MG/ML IJ SOLN
INTRAMUSCULAR | Status: DC | PRN
  Administered 2022-07-07: 5 mg via INTRAVENOUS

## 2022-07-07 MED ORDER — SUGAMMADEX SODIUM 200 MG/2ML IV SOLN
INTRAVENOUS | Status: DC | PRN
  Administered 2022-07-07: 200 mg via INTRAVENOUS

## 2022-07-07 MED ORDER — LOSARTAN POTASSIUM 50 MG PO TABS
50.0000 mg | ORAL_TABLET | Freq: Every morning | ORAL | Status: DC
  Administered 2022-07-08: 50 mg via ORAL
  Filled 2022-07-07: qty 1

## 2022-07-07 MED ORDER — CHLORHEXIDINE GLUCONATE 0.12 % MT SOLN
15.0000 mL | Freq: Once | OROMUCOSAL | Status: AC
  Administered 2022-07-07: 15 mL via OROMUCOSAL
  Filled 2022-07-07: qty 15

## 2022-07-07 MED ORDER — ACETAMINOPHEN 325 MG PO TABS
650.0000 mg | ORAL_TABLET | ORAL | Status: DC | PRN
  Administered 2022-07-07: 650 mg via ORAL
  Filled 2022-07-07: qty 2

## 2022-07-07 MED ORDER — LACTATED RINGERS IV SOLN: INTRAVENOUS | Status: DC

## 2022-07-07 MED ORDER — ROCURONIUM BROMIDE 10 MG/ML (PF) SYRINGE
PREFILLED_SYRINGE | INTRAVENOUS | Status: DC | PRN
  Administered 2022-07-07: 10 mg via INTRAVENOUS
  Administered 2022-07-07: 60 mg via INTRAVENOUS

## 2022-07-07 MED ORDER — PROPOFOL 500 MG/50ML IV EMUL
INTRAVENOUS | Status: DC | PRN
  Administered 2022-07-07: 20 ug/kg/min via INTRAVENOUS

## 2022-07-07 MED ORDER — CHLORHEXIDINE GLUCONATE CLOTH 2 % EX PADS: 6.0000 | MEDICATED_PAD | Freq: Once | CUTANEOUS | Status: DC

## 2022-07-07 MED ORDER — PROPOFOL 10 MG/ML IV BOLUS
INTRAVENOUS | Status: AC
  Filled 2022-07-07: qty 20

## 2022-07-07 MED ORDER — THROMBIN 5000 UNITS EX SOLR
CUTANEOUS | Status: DC | PRN
  Administered 2022-07-07: 10000 [IU] via TOPICAL

## 2022-07-07 MED ORDER — THROMBIN 5000 UNITS EX SOLR
CUTANEOUS | Status: AC
  Filled 2022-07-07: qty 5000

## 2022-07-07 MED ORDER — CEFAZOLIN SODIUM-DEXTROSE 2-4 GM/100ML-% IV SOLN
2.0000 g | INTRAVENOUS | Status: AC
  Administered 2022-07-07: 2 g via INTRAVENOUS
  Filled 2022-07-07: qty 100

## 2022-07-07 MED ORDER — SODIUM CHLORIDE 0.9% FLUSH
3.0000 mL | Freq: Two times a day (BID) | INTRAVENOUS | Status: DC
  Administered 2022-07-07 (×2): 3 mL via INTRAVENOUS

## 2022-07-07 MED ORDER — BUPIVACAINE HCL (PF) 0.25 % IJ SOLN
INTRAMUSCULAR | Status: DC | PRN
  Administered 2022-07-07: 10 mL

## 2022-07-07 MED ORDER — TURMERIC CURCUMIN PO CAPS: ORAL_CAPSULE | Freq: Every day | ORAL | Status: DC

## 2022-07-07 MED ORDER — PROPOFOL 10 MG/ML IV BOLUS
INTRAVENOUS | Status: DC | PRN
  Administered 2022-07-07: 110 mg via INTRAVENOUS

## 2022-07-07 MED ORDER — MENTHOL 3 MG MT LOZG: 1.0000 | LOZENGE | OROMUCOSAL | Status: DC | PRN

## 2022-07-07 MED ORDER — DIPHENHYDRAMINE HCL 25 MG PO CAPS
25.0000 mg | ORAL_CAPSULE | Freq: Every day | ORAL | Status: DC
  Administered 2022-07-07: 25 mg via ORAL
  Filled 2022-07-07 (×2): qty 1

## 2022-07-07 MED ORDER — THROMBIN 5000 UNITS EX SOLR
CUTANEOUS | Status: AC
  Filled 2022-07-07: qty 10000

## 2022-07-07 MED ORDER — FENTANYL CITRATE (PF) 250 MCG/5ML IJ SOLN
INTRAMUSCULAR | Status: AC
  Filled 2022-07-07: qty 5

## 2022-07-07 MED ORDER — EMPAGLIFLOZIN 10 MG PO TABS
10.0000 mg | ORAL_TABLET | Freq: Every morning | ORAL | Status: DC
  Administered 2022-07-08: 10 mg via ORAL
  Filled 2022-07-07: qty 1

## 2022-07-07 MED ORDER — TORSEMIDE 20 MG PO TABS: 20.0000 mg | ORAL_TABLET | Freq: Every day | ORAL | Status: DC

## 2022-07-07 MED ORDER — ACETAMINOPHEN 500 MG PO TABS: 1000.0000 mg | ORAL_TABLET | Freq: Four times a day (QID) | ORAL | Status: DC | PRN

## 2022-07-07 MED ORDER — FLUTICASONE PROPIONATE 50 MCG/ACT NA SUSP
2.0000 | Freq: Every morning | NASAL | Status: DC
  Filled 2022-07-07: qty 16

## 2022-07-07 MED ORDER — LIDOCAINE-EPINEPHRINE 1 %-1:100000 IJ SOLN
INTRAMUSCULAR | Status: DC | PRN
  Administered 2022-07-07: 10 mL

## 2022-07-07 MED ORDER — ONDANSETRON HCL 4 MG/2ML IJ SOLN: 4.0000 mg | Freq: Four times a day (QID) | INTRAMUSCULAR | Status: DC | PRN

## 2022-07-07 MED ORDER — SODIUM BICARBONATE 8.4 % IV SOLN
INTRAVENOUS | Status: AC
  Filled 2022-07-07: qty 50

## 2022-07-07 MED ORDER — FENTANYL CITRATE (PF) 250 MCG/5ML IJ SOLN
INTRAMUSCULAR | Status: DC | PRN
  Administered 2022-07-07 (×2): 50 ug via INTRAVENOUS

## 2022-07-07 MED ORDER — ONDANSETRON HCL 4 MG PO TABS: 4.0000 mg | ORAL_TABLET | Freq: Four times a day (QID) | ORAL | Status: DC | PRN

## 2022-07-07 MED ORDER — INSULIN ASPART 100 UNIT/ML IJ SOLN: 0.0000 [IU] | INTRAMUSCULAR | Status: DC | PRN

## 2022-07-07 MED ORDER — PHENOL 1.4 % MT LIQD: 1.0000 | OROMUCOSAL | Status: DC | PRN

## 2022-07-07 MED ORDER — SODIUM CHLORIDE 0.9% FLUSH: 3.0000 mL | INTRAVENOUS | Status: DC | PRN

## 2022-07-07 MED ORDER — PANTOPRAZOLE SODIUM 40 MG IV SOLR
40.0000 mg | Freq: Every day | INTRAVENOUS | Status: DC
  Administered 2022-07-07: 40 mg via INTRAVENOUS
  Filled 2022-07-07: qty 10

## 2022-07-07 MED ORDER — 0.9 % SODIUM CHLORIDE (POUR BTL) OPTIME
TOPICAL | Status: DC | PRN
  Administered 2022-07-07: 1000 mL

## 2022-07-07 MED ORDER — ALUM & MAG HYDROXIDE-SIMETH 200-200-20 MG/5ML PO SUSP: 30.0000 mL | Freq: Four times a day (QID) | ORAL | Status: DC | PRN

## 2022-07-07 MED ORDER — HYDROCODONE-ACETAMINOPHEN 5-325 MG PO TABS
2.0000 | ORAL_TABLET | ORAL | Status: DC | PRN
  Administered 2022-07-07 – 2022-07-08 (×4): 2 via ORAL
  Filled 2022-07-07 (×4): qty 2

## 2022-07-07 MED ORDER — BUPIVACAINE HCL (PF) 0.25 % IJ SOLN
INTRAMUSCULAR | Status: AC
  Filled 2022-07-07: qty 30

## 2022-07-07 MED ORDER — GELATIN ABSORBABLE 12-7 MM EX MISC
CUTANEOUS | Status: DC | PRN
  Administered 2022-07-07: 1

## 2022-07-07 MED ORDER — SODIUM CHLORIDE 0.9 % IV SOLN
250.0000 mL | INTRAVENOUS | Status: DC
  Administered 2022-07-07: 250 mL via INTRAVENOUS

## 2022-07-07 MED ORDER — LIDOCAINE 2% (20 MG/ML) 5 ML SYRINGE
INTRAMUSCULAR | Status: DC | PRN
  Administered 2022-07-07: 40 mg via INTRAVENOUS

## 2022-07-07 MED ORDER — PHENYLEPHRINE HCL-NACL 20-0.9 MG/250ML-% IV SOLN
INTRAVENOUS | Status: DC | PRN
  Administered 2022-07-07: 25 ug/min via INTRAVENOUS

## 2022-07-07 MED ORDER — ACETAMINOPHEN 650 MG RE SUPP: 650.0000 mg | RECTAL | Status: DC | PRN

## 2022-07-07 MED ORDER — CEFAZOLIN SODIUM-DEXTROSE 2-4 GM/100ML-% IV SOLN
2.0000 g | Freq: Three times a day (TID) | INTRAVENOUS | Status: AC
  Administered 2022-07-07 (×2): 2 g via INTRAVENOUS
  Filled 2022-07-07 (×2): qty 100

## 2022-07-07 MED ORDER — ORAL CARE MOUTH RINSE: 15.0000 mL | Freq: Once | OROMUCOSAL | Status: AC

## 2022-07-07 SURGICAL SUPPLY — 48 items
BAG COUNTER SPONGE SURGICOUNT (BAG) ×1 IMPLANT
BAND RUBBER #18 3X1/16 STRL (MISCELLANEOUS) ×2 IMPLANT
BENZOIN TINCTURE PRP APPL 2/3 (GAUZE/BANDAGES/DRESSINGS) ×1 IMPLANT
BLADE SURG 11 STRL SS (BLADE) ×1 IMPLANT
BUR CUTTER 7.0 ROUND (BURR) ×1 IMPLANT
BUR MATCHSTICK NEURO 3.0 LAGG (BURR) ×1 IMPLANT
CANISTER SUCT 3000ML PPV (MISCELLANEOUS) ×1 IMPLANT
CLSR STERI-STRIP ANTIMIC 1/2X4 (GAUZE/BANDAGES/DRESSINGS) IMPLANT
DERMABOND ADVANCED .7 DNX12 (GAUZE/BANDAGES/DRESSINGS) ×1 IMPLANT
DERMABOND ADVANCED .7 DNX6 (GAUZE/BANDAGES/DRESSINGS) IMPLANT
DRAPE HALF SHEET 40X57 (DRAPES) IMPLANT
DRAPE LAPAROTOMY 100X72X124 (DRAPES) ×1 IMPLANT
DRAPE MICROSCOPE SLANT 54X150 (MISCELLANEOUS) ×1 IMPLANT
DRAPE SURG 17X23 STRL (DRAPES) ×1 IMPLANT
DRSG OPSITE 4X5.5 SM (GAUZE/BANDAGES/DRESSINGS) IMPLANT
DRSG OPSITE POSTOP 4X6 (GAUZE/BANDAGES/DRESSINGS) IMPLANT
DURAPREP 26ML APPLICATOR (WOUND CARE) ×1 IMPLANT
ELECT BLADE 4.0 EZ CLEAN MEGAD (MISCELLANEOUS) ×1
ELECT REM PT RETURN 9FT ADLT (ELECTROSURGICAL) ×1
ELECTRODE BLDE 4.0 EZ CLN MEGD (MISCELLANEOUS) IMPLANT
ELECTRODE REM PT RTRN 9FT ADLT (ELECTROSURGICAL) ×1 IMPLANT
EVACUATOR 1/8 PVC DRAIN (DRAIN) IMPLANT
GAUZE 4X4 16PLY ~~LOC~~+RFID DBL (SPONGE) IMPLANT
GAUZE SPONGE 4X4 12PLY STRL (GAUZE/BANDAGES/DRESSINGS) ×1 IMPLANT
GLOVE BIO SURGEON STRL SZ8 (GLOVE) ×1 IMPLANT
GLOVE BIOGEL PI IND STRL 7.0 (GLOVE) IMPLANT
GLOVE INDICATOR 8.5 STRL (GLOVE) ×2 IMPLANT
GOWN STRL REUS W/ TWL LRG LVL3 (GOWN DISPOSABLE) ×1 IMPLANT
GOWN STRL REUS W/ TWL XL LVL3 (GOWN DISPOSABLE) ×2 IMPLANT
GOWN STRL REUS W/TWL LRG LVL3 (GOWN DISPOSABLE) ×1
GOWN STRL REUS W/TWL XL LVL3 (GOWN DISPOSABLE) ×2
KIT BASIN OR (CUSTOM PROCEDURE TRAY) ×1 IMPLANT
KIT TURNOVER KIT B (KITS) ×1 IMPLANT
NDL SPNL 22GX3.5 QUINCKE BK (NEEDLE) ×1 IMPLANT
NEEDLE HYPO 22GX1.5 SAFETY (NEEDLE) ×1 IMPLANT
NEEDLE SPNL 22GX3.5 QUINCKE BK (NEEDLE) ×1 IMPLANT
NS IRRIG 1000ML POUR BTL (IV SOLUTION) ×1 IMPLANT
PACK LAMINECTOMY NEURO (CUSTOM PROCEDURE TRAY) ×1 IMPLANT
SPIKE FLUID TRANSFER (MISCELLANEOUS) ×1 IMPLANT
SPONGE SURGIFOAM ABS GEL SZ50 (HEMOSTASIS) ×1 IMPLANT
STRIP CLOSURE SKIN 1/2X4 (GAUZE/BANDAGES/DRESSINGS) ×1 IMPLANT
SUT VIC AB 0 CT1 18XCR BRD8 (SUTURE) ×1 IMPLANT
SUT VIC AB 0 CT1 8-18 (SUTURE) ×1
SUT VIC AB 2-0 CT1 18 (SUTURE) ×1 IMPLANT
SUT VICRYL 4-0 PS2 18IN ABS (SUTURE) ×1 IMPLANT
TOWEL GREEN STERILE (TOWEL DISPOSABLE) ×1 IMPLANT
TOWEL GREEN STERILE FF (TOWEL DISPOSABLE) ×1 IMPLANT
WATER STERILE IRR 1000ML POUR (IV SOLUTION) ×1 IMPLANT

## 2022-07-07 NOTE — Anesthesia Postprocedure Evaluation (Signed)
Anesthesia Post Note  Patient: Ruth Harris  Procedure(s) Performed: Sublaminar decompression - L3-L4 - L4-L5 - bilateral (Bilateral: Back)     Patient location during evaluation: PACU Anesthesia Type: General Level of consciousness: awake and alert Pain management: pain level controlled Vital Signs Assessment: post-procedure vital signs reviewed and stable Respiratory status: spontaneous breathing, nonlabored ventilation, respiratory function stable and patient connected to nasal cannula oxygen Cardiovascular status: blood pressure returned to baseline and stable Postop Assessment: no apparent nausea or vomiting Anesthetic complications: no   No notable events documented.  Last Vitals:  Vitals:   07/07/22 1415 07/07/22 1444  BP: 106/66 117/73  Pulse: 83 85  Resp: 19 17  Temp: 36.4 C 36.6 C  SpO2: 93% 95%    Last Pain:  Vitals:   07/07/22 1415  TempSrc:   PainSc: 0-No pain                 Effie Berkshire

## 2022-07-07 NOTE — Op Note (Signed)
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  Postoperative diagnosis: Same.  Procedure: Decompressive lumbar laminectomies L3-4 L4-5 with partial medial facetectomies and foraminotomies of the L4 and L5 nerve roots bilaterally.  Surgeon: Kary Kos.  Assistant: Nash Shearer.  Anesthesia: General.  EBL: Minimal.  HPI: 76 year old female with back pain right greater than left leg pain neurogenic claudication work-up revealed severe spinal stenosis at L4-5 moderate spinal stenosis L3-4 compression of the L4 and L5 nerve roots bilaterally.  Due to failure of conservative treatment imaging findings and progression of clinical syndrome I recommended decompressive laminectomies and foraminotomies at those levels.  I extensively went over the risks and benefits of the operation with the patient as well as perioperative course expectations of outcome and alternatives of surgery and she understood and agreed to proceed forward.  Operative procedure: Patient was brought into the OR was induced under general anesthesia positioned prone the Wilson frame her back was prepped and draped in routine sterile fashion.  Preoperative x-ray localized the appropriate level.  After infiltration of 10 cc lidocaine with epi midline incision was made and Bovie electrocautery was used to take down the subcutaneous tissue and subperiosteal dissection was carried lamina of L3-L4 and L5.  Intraoperative x-ray confirmed application of the L4 pedicle.  So the spinal process and lamina of L4 was completely removed the inferior aspect the spinous process of L3 and superior aspect the spinous process at L5 was also removed central decompression was begun with complete laminectomy of L4 remove the inferior aspect of the L3-4 lamina and under bit the ligament flavum at that level centrally there was marked hourglass compression of the thecal sac predominantly at L4-5 this was all teased off of the dura  and removed in piecemeal fashion.  Foraminotomies were performed of the L4 nerve roots as well as the L5 nerve roots and the superior aspect of the L5 lamina was also removed.  At the end of discectomy there is no further stenosis either centrally or foraminally at L4 and L5 wounds and copiously irrigated meticulous hemostasis was maintained a medium Hemovac drain was placed and the wound was closed in layers with interrupted Vicryl and a running 4 subcuticular.  Dermabond benzoin Steri-Strips and a sterile dressing was applied patient recovery in stable condition.  At the end the case all needle counts and sponge counts were correct.

## 2022-07-07 NOTE — Anesthesia Preprocedure Evaluation (Addendum)
Anesthesia Evaluation  Patient identified by MRN, date of birth, ID band Patient awake    Reviewed: Allergy & Precautions, NPO status , Patient's Chart, lab work & pertinent test results  History of Anesthesia Complications (+) PONV and history of anesthetic complications  Airway Mallampati: II  TM Distance: >3 FB Neck ROM: Full    Dental  (+) Dental Advisory Given   Pulmonary asthma ,    breath sounds clear to auscultation       Cardiovascular hypertension,  Rhythm:Regular Rate:Normal     Neuro/Psych  Headaches, negative psych ROS   GI/Hepatic Neg liver ROS, GERD  ,  Endo/Other  diabetes, Type 2, Oral Hypoglycemic Agents  Renal/GU negative Renal ROS     Musculoskeletal negative musculoskeletal ROS (+)   Abdominal   Peds  Hematology negative hematology ROS (+)   Anesthesia Other Findings   Reproductive/Obstetrics                            Anesthesia Physical Anesthesia Plan  ASA: 2  Anesthesia Plan: General   Post-op Pain Management:    Induction: Intravenous  PONV Risk Score and Plan: 4 or greater and Ondansetron, Treatment may vary due to age or medical condition, Propofol infusion and Amisulpride  Airway Management Planned: Oral ETT  Additional Equipment: None  Intra-op Plan:   Post-operative Plan: Extubation in OR  Informed Consent: I have reviewed the patients History and Physical, chart, labs and discussed the procedure including the risks, benefits and alternatives for the proposed anesthesia with the patient or authorized representative who has indicated his/her understanding and acceptance.       Plan Discussed with: CRNA  Anesthesia Plan Comments:        Anesthesia Quick Evaluation

## 2022-07-07 NOTE — Anesthesia Procedure Notes (Signed)
Procedure Name: Intubation Date/Time: 07/07/2022 10:37 AM  Performed by: Gaylene Brooks, CRNAPre-anesthesia Checklist: Patient identified, Emergency Drugs available, Suction available and Patient being monitored Patient Re-evaluated:Patient Re-evaluated prior to induction Oxygen Delivery Method: Circle System Utilized Preoxygenation: Pre-oxygenation with 100% oxygen Induction Type: IV induction Ventilation: Mask ventilation without difficulty and Oral airway inserted - appropriate to patient size Laryngoscope Size: Sabra Heck and 2 Grade View: Grade I Tube type: Oral Number of attempts: 1 Airway Equipment and Method: Stylet and Oral airway Placement Confirmation: ETT inserted through vocal cords under direct vision, positive ETCO2 and breath sounds checked- equal and bilateral Secured at: 21 cm Tube secured with: Tape Dental Injury: Teeth and Oropharynx as per pre-operative assessment

## 2022-07-07 NOTE — H&P (Signed)
Ruth Harris is an 76 y.o. female.   Chief Complaint: Back bilateral leg pain neurogenic claudication HPI: 76 year old female with back pain difficulty walking leg fatigue and pain rating down L4 and L5 distribution.  Due to patient's progression of clinical syndrome imaging findings consistent with severe spinal stenosis at L4-5 and lateral recess stenosis at L3-4 I recommended decompressive laminectomy and foraminotomies at L3-4 and L4-5.  We have extensively gone over the risks and benefits of that operation with her as well as perioperative course expectations of outcome and alternatives of surgery and she understood and agreed to proceed forward.  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 No issues.   Hypertension    PONV (postoperative nausea and vomiting)    Seasonal allergies     Past Surgical History:  Procedure Laterality Date   BREAST SURGERY     DCIS, lumpectomy   CATARACT EXTRACTION W/PHACO Left 05/26/2015   Procedure: CATARACT EXTRACTION PHACO AND INTRAOCULAR LENS PLACEMENT ([place]);  Surgeon: Leandrew Koyanagi, MD;  Location: [facility];  Service: Ophthalmology;  Laterality: Left;  DIABETIC   CATARACT EXTRACTION W/PHACO Right 06/09/2015   Procedure: CATARACT EXTRACTION PHACO AND INTRAOCULAR LENS PLACEMENT ([facility]);  Surgeon: Leandrew Koyanagi, MD;  Location: [facility];  Service: Ophthalmology;  Laterality: Right;  DIABETIC - oral meds   COLONOSCOPY WITH PROPOFOL N/A 12/09/2015   Procedure: COLONOSCOPY WITH PROPOFOL;  Surgeon: Manya Silvas, MD;  Location: [facility] ENDOSCOPY;  Service: Endoscopy;  Laterality: N/A;   EYE SURGERY     HAMMER TOE SURGERY Right    SHOULDER ARTHROSCOPY WITH OPEN ROTATOR CUFF  REPAIR Left 12/19/2018   Procedure: LEFT SHOULDER ARTHROSCOPY WITH SUBACROMIAL DECOMPRESSION, DISTAL CLAVICLE EXCISION, MINI OPEN ROTATOR CUFF REPAIR;  Surgeon: Thornton Park, MD;  Location: [facility];  Service: Orthopedics;  Laterality: Left;   TOE SURGERY Right    corn removed from 5th toe   TONSILLECTOMY     TUBAL LIGATION      History reviewed. No pertinent family history. Social History:  reports that she has never smoked. She has never used smokeless tobacco. She reports that she does not drink alcohol and does not use drugs.  Allergies:  Allergies  Allergen Reactions   Shellac Rash   Trazodone Rash    Medications Prior to Admission  Medication Sig Dispense Refill   acetaminophen (TYLENOL) 500 MG tablet Take 1,000 mg by mouth in the morning and at bedtime.     Ascorbic Acid (VITAMIN C PO) Take 1 tablet by mouth daily.     atorvastatin (LIPITOR) 20 MG tablet Take 20 mg by mouth in the morning.     Cyanocobalamin (VITAMIN B-12 PO) Take 1 tablet by mouth daily.     diphenhydrAMINE (BENADRYL) 25 MG tablet Take 25 mg by mouth at bedtime.     fluticasone (FLONASE) 50 MCG/ACT nasal spray Place 2 sprays into both nostrils in the morning.     JARDIANCE 10 MG TABS tablet Take 10 mg by mouth in the morning.     losartan (COZAAR) 25 MG tablet Take 50 mg by mouth in the morning.     metFORMIN (GLUCOPHAGE-XR) 500 MG 24  hr tablet Take 1,500 mg by mouth every evening.     torsemide (DEMADEX) 20 MG tablet Take 20 mg by mouth daily as needed for fluid.     TURMERIC CURCUMIN PO Take 1 tablet by mouth daily.      Results for orders placed or performed during the hospital encounter of 07/07/22 (from the past 48 hour(s))  Glucose, capillary     Status: Abnormal   Collection Time: 07/07/22  7:48 AM  Result Value Ref Range   Glucose-Capillary 140 (H) 70 - 99 mg/dL    Comment: Glucose reference range applies only to samples taken after fasting for at least 8 hours.  Basic metabolic panel      Status: Abnormal   Collection Time: 07/07/22  8:10 AM  Result Value Ref Range   Sodium 139 135 - 145 mmol/L   Potassium 4.0 3.5 - 5.1 mmol/L   Chloride 106 98 - 111 mmol/L   CO2 25 22 - 32 mmol/L   Glucose, Bld 129 (H) 70 - 99 mg/dL    Comment: Glucose reference range applies only to samples taken after fasting for at least 8 hours.   BUN 26 (H) 8 - 23 mg/dL   Creatinine, Ser 1.18 (H) 0.44 - 1.00 mg/dL   Calcium 9.5 8.9 - 10.3 mg/dL   GFR, Estimated 48 (L) >60 mL/min    Comment: (NOTE) Calculated using the CKD-EPI Creatinine Equation (2021)    Anion gap 8 5 - 15    Comment: Performed at [facility] [address] [place], [place] [address]   No results found.  Review of Systems  Musculoskeletal:  Positive for back pain.  Neurological:  Positive for numbness.    Blood pressure (!) 158/72, pulse 78, temperature 98.4 F (36.9 C), temperature source Oral, resp. rate 18, height '5\' 2"'$  (1.575 m), weight 89.8 kg, SpO2 100 %. Physical Exam HENT:     Head: Normocephalic.     Right Ear: Tympanic membrane normal.     Nose: Nose normal.     Mouth/Throat:     Mouth: Mucous membranes are moist.  Eyes:     Pupils: Pupils are equal, round, and reactive to light.  Cardiovascular:     Rate and Rhythm: Normal rate.  Pulmonary:     Effort: Pulmonary effort is normal.  Abdominal:     General: Abdomen is flat.  Musculoskeletal:        General: Normal range of motion.  Skin:    General: Skin is warm.  Neurological:     Mental Status: She is alert.     Comments: Strength 5 out of 5 iliopsoas, quads, hamstrings, gastrocs, into tibialis, and EHL.      Assessment/Plan 76 year old presents for decompressive laminectomies L3-4 L4-5  Elaina Hoops, MD 07/07/2022, 9:29 AM

## 2022-07-07 NOTE — Transfer of Care (Signed)
Immediate Anesthesia Transfer of Care Note  Patient: Ruth Harris  Procedure(s) Performed: Sublaminar decompression - L3-L4 - L4-L5 - bilateral (Bilateral: Back)  Patient Location: PACU  Anesthesia Type:General  Level of Consciousness: awake, drowsy and patient cooperative  Airway & Oxygen Therapy: Patient Spontanous Breathing and Patient connected to nasal cannula oxygen  Post-op Assessment: Report given to RN, Post -op Vital signs reviewed and stable and Patient moving all extremities X 4  Post vital signs: Reviewed and stable  Last Vitals:  Vitals Value Taken Time  BP 128/43 07/07/22 1250  Temp    Pulse 80 07/07/22 1254  Resp 25 07/07/22 1254  SpO2 96 % 07/07/22 1254  Vitals shown include unvalidated device data.  Last Pain:  Vitals:   07/07/22 0809  TempSrc:   PainSc: 0-No pain         Complications: No notable events documented.

## 2022-07-08 ENCOUNTER — Encounter (HOSPITAL_COMMUNITY): Payer: Self-pay | Admitting: Neurosurgery

## 2022-07-08 DIAGNOSIS — M48062 Spinal stenosis, lumbar region with neurogenic claudication: Secondary | ICD-10-CM | POA: Diagnosis not present

## 2022-07-08 LAB — GLUCOSE, CAPILLARY: Glucose-Capillary: 122 mg/dL — ABNORMAL HIGH (ref 70–99)

## 2022-07-08 MED ORDER — HYDROCODONE-ACETAMINOPHEN 5-325 MG PO TABS: 1.0000 | ORAL_TABLET | ORAL | 0 refills | Status: DC | PRN

## 2022-07-08 NOTE — Progress Notes (Signed)
PT Cancellation/Sign Off Note  Patient Details Name: NAHOMY LIMBURG MRN: [id number] DOB: [birth year]   Cancelled Treatment:    Reason Eval/Treat Not Completed: PT screened, no needs identified, will sign off Patient up walking in room without device when PT arrived. She had worked with OT prior to my arrival. Able to state 3/3 back precautions without cues. She reports she feels good about her mobility. I have encouraged the patient to gradually increase activity daily to tolerance.   I have answered all patient's question regarding PT and mobility.  Pt declines PT evaluation at this time and feels ready for DC home today and will have assistance at home for the next several days.    Melvern Banker 07/08/2022, 9:20 AM  Scherry Laverne [place]  Office [phone] 07/08/2022

## 2022-07-08 NOTE — Evaluation (Signed)
Occupational Therapy Evaluation Patient Details Name: Ruth Harris MRN: [id number] DOB: 17-Jan-1946 Today's Date: 07/08/2022   History of Present Illness 76 yo F adm for scheduled decompressive lumbar laminectomies L3-4 L4-5.  PMH includes: DM, GERD, HTN.   Clinical Impression   Patient admitted for the procedure above.  PTA she lives alone, and has assist as needed from friends and family.  Patient stated she has had difficulty reaching her feet at prior level.  OT demonstrated the hip kit, and patient will order one.  Otherwise she is very close to her baseline, per the patient with mobility.  Patient with a good understanding of all precautions, and does not have any further needs in the acute setting.  Recommend follow up with MD as prescribed, and discuss some outpatient PT as appropriate.        Recommendations for follow up therapy are one component of a multi-disciplinary discharge planning process, led by the attending physician.  Recommendations may be updated based on patient status, additional functional criteria and insurance authorization.   Follow Up Recommendations  No OT follow up    Assistance Recommended at Discharge Set up Supervision/Assistance  Patient can return home with the following Assist for transportation;Assistance with cooking/housework    Functional Status Assessment  Patient has not had a recent decline in their functional status  Equipment Recommendations  None recommended by OT    Recommendations for Other Services       Precautions / Restrictions Precautions Precautions: Back Precaution Booklet Issued: Yes (comment) Restrictions Weight Bearing Restrictions: No      Mobility Bed Mobility Overal bed mobility: Modified Independent             General bed mobility comments: increased time    Transfers Overall transfer level: Modified independent Equipment used: None               General transfer comment: wide based gait with  short choppy steps.  has a RW at home if needed.  Baseine mobility for her.  Wants to start outpatient PT again once cleared.      Balance Overall balance assessment: Mild deficits observed, not formally tested                                         ADL either performed or assessed with clinical judgement   ADL               Lower Body Bathing: Minimal assistance;With adaptive equipment           Toilet Transfer: Independent;Ambulation                   Vision Baseline Vision/History: 1 Wears glasses Patient Visual Report: No change from baseline       Perception Perception Perception: Within Functional Limits   Praxis Praxis Praxis: Intact    Pertinent Vitals/Pain Pain Assessment Pain Assessment: Faces Faces Pain Scale: Hurts a little bit Pain Location: stiffness to low back Pain Descriptors / Indicators: Sore Pain Intervention(s): Monitored during session     Hand Dominance Right   Extremity/Trunk Assessment Upper Extremity Assessment Upper Extremity Assessment: Overall WFL for tasks assessed   Lower Extremity Assessment Lower Extremity Assessment: Generalized weakness;RLE deficits/detail RLE Deficits / Details: R leg weaker than left.  Has done outpatient PT in the past. RLE Sensation: WNL RLE Coordination: WNL   Cervical / Trunk  Assessment Cervical / Trunk Assessment: Kyphotic;Back Surgery   Communication Communication Communication: No difficulties   Cognition Arousal/Alertness: Awake/alert Behavior During Therapy: WFL for tasks assessed/performed Overall Cognitive Status: Within Functional Limits for tasks assessed                                       General Comments   VSS on RA    Exercises     Shoulder Instructions      Home Living Family/patient expects to be discharged to:: Private residence Living Arrangements: Alone Available Help at Discharge: Family;Friend(s);Available 24  hours/day Type of Home: House Home Access: Stairs to enter;Ramped entrance Entrance Stairs-Number of Steps: Ramp to the back and 4 STE the front. Entrance Stairs-Rails: Left;Right;Can reach both Home Layout: One level     Bathroom Shower/Tub: Occupational psychologist: Standard Bathroom Accessibility: Yes How Accessible: Accessible via walker Home Equipment: Rolling Walker (2 wheels);Shower seat;Adaptive equipment Adaptive Equipment: Reacher        Prior Functioning/Environment Prior Level of Function : Independent/Modified Independent;Driving                        OT Problem List: Pain;Impaired balance (sitting and/or standing)      OT Treatment/Interventions:      OT Goals(Current goals can be found in the care plan section) Acute Rehab OT Goals Patient Stated Goal: Return home OT Goal Formulation: With patient Time For Goal Achievement: 07/10/22 Potential to Achieve Goals: Good  OT Frequency:      Co-evaluation              AM-PAC OT "6 Clicks" Daily Activity     Outcome Measure Help from another person eating meals?: None Help from another person taking care of personal grooming?: None Help from another person toileting, which includes using toliet, bedpan, or urinal?: None Help from another person bathing (including washing, rinsing, drying)?: A Little Help from another person to put on and taking off regular upper body clothing?: None Help from another person to put on and taking off regular lower body clothing?: A Little 6 Click Score: 22   End of Session Equipment Utilized During Treatment: Gait belt Nurse Communication: Mobility status  Activity Tolerance: Patient tolerated treatment well Patient left: in bed;with call bell/phone within reach  OT Visit Diagnosis: Unsteadiness on feet (R26.81)                Time: 3664-4034 OT Time Calculation (min): 23 min Charges:  OT General Charges $OT Visit: 1 Visit OT Evaluation $OT Eval  Moderate Complexity: 1 Mod OT Treatments $Self Care/Home Management : 8-22 mins  07/08/2022  RP, OTR/L  Acute Rehabilitation Services  Office:  [phone]   Metta Clines 07/08/2022, 9:08 AM

## 2022-07-08 NOTE — Discharge Summary (Signed)
Discharge Summary  Date of Admission: 07/07/2022  Date of Discharge: 07/08/22  Attending Physician: Emelda Brothers, MD  Hospital Course: Patient was admitted following an uncomplicated lumbar decompression. They were recovered in PACU and transferred to [facility]. Their preop symptoms were completely resolved, their hospital course was uncomplicated and the patient was discharged home on 07/08/22. They will follow up in clinic with me in clinic in 2 weeks.  Neurologic exam at discharge:  Strength 5/5 x4 and SILTx4   Discharge diagnosis: Lumbar radiculopathy  Judith Part, MD 07/08/22 8:13 AM

## 2022-07-08 NOTE — Progress Notes (Signed)
Patient alert and oriented, mae's well, voiding adequate amount of urine, swallowing without difficulty, no c/o pain at time of discharge. Patient discharged home with family. Script and discharged instructions given to patient. Patient and family stated understanding of instructions given. Patient has an appointment with Dr. Cram 

## 2022-07-08 NOTE — Progress Notes (Signed)
Neurosurgery Service Progress Note  Subjective: No acute events overnight, legs improved from preop   Objective: Vitals:   07/07/22 1936 07/07/22 2308 07/08/22 0319 07/08/22 0707  BP: 129/69 (!) 117/49 (!) 132/58 (!) 133/53  Pulse: 88 79 76 81  Resp: '18 18 18 18  '$ Temp: 97.9 F (36.6 C) 99 F (37.2 C) 98.2 F (36.8 C) 99.1 F (37.3 C)  TempSrc:  Oral Oral Oral  SpO2: 92% 96% 97% 95%  Weight:      Height:        Physical Exam: Strength 5/5 x4 and SILTx4  Assessment & Plan: 76 y.o. woman s/p L3-5 decompression, recovering well.  -discharge home today  Judith Part  07/08/22 8:12 AM

## 2022-07-08 NOTE — Plan of Care (Signed)
  Problem: Education: Goal: Ability to describe self-care measures that may prevent or decrease complications (Diabetes Survival Skills Education) will improve Outcome: Completed/Met Goal: Individualized Educational Video(s) Outcome: Completed/Met   Problem: Coping: Goal: Ability to adjust to condition or change in health will improve Outcome: Completed/Met   Problem: Fluid Volume: Goal: Ability to maintain a balanced intake and output will improve Outcome: Completed/Met   Problem: Health Behavior/Discharge Planning: Goal: Ability to identify and utilize available resources and services will improve Outcome: Completed/Met Goal: Ability to manage health-related needs will improve Outcome: Completed/Met   Problem: Metabolic: Goal: Ability to maintain appropriate glucose levels will improve Outcome: Completed/Met   Problem: Nutritional: Goal: Maintenance of adequate nutrition will improve Outcome: Completed/Met Goal: Progress toward achieving an optimal weight will improve Outcome: Completed/Met   Problem: Skin Integrity: Goal: Risk for impaired skin integrity will decrease Outcome: Completed/Met   Problem: Tissue Perfusion: Goal: Adequacy of tissue perfusion will improve Outcome: Completed/Met   Problem: Education: Goal: Ability to verbalize activity precautions or restrictions will improve Outcome: Completed/Met Goal: Knowledge of the prescribed therapeutic regimen will improve Outcome: Completed/Met Goal: Understanding of discharge needs will improve Outcome: Completed/Met   Problem: Activity: Goal: Ability to avoid complications of mobility impairment will improve Outcome: Completed/Met Goal: Ability to tolerate increased activity will improve Outcome: Completed/Met Goal: Will remain free from falls Outcome: Completed/Met   Problem: Bowel/Gastric: Goal: Gastrointestinal status for postoperative course will improve Outcome: Completed/Met   Problem: Clinical  Measurements: Goal: Ability to maintain clinical measurements within normal limits will improve Outcome: Completed/Met Goal: Postoperative complications will be avoided or minimized Outcome: Completed/Met Goal: Diagnostic test results will improve Outcome: Completed/Met   Problem: Pain Management: Goal: Pain level will decrease Outcome: Completed/Met   Problem: Skin Integrity: Goal: Will show signs of wound healing Outcome: Completed/Met   Problem: Health Behavior/Discharge Planning: Goal: Identification of resources available to assist in meeting health care needs will improve Outcome: Completed/Met   Problem: Bladder/Genitourinary: Goal: Urinary functional status for postoperative course will improve Outcome: Completed/Met

## 2022-07-08 NOTE — Discharge Instructions (Signed)
Wound Care  Keep the incision clean and dry remove the outer dressing in 3 days, leave the Steri-Strips intact. Wrap with Saran wrap for showers only Do not put any creams, lotions, or ointments on incision. Leave steri-strips on back.  They will fall off by themselves.  Activity Walk each and every day, increasing distance each day. No lifting greater than 5 lbs.  No lifting no bending no twisting no driving or riding a car unless coming back and forth to see me.  Diet Resume your normal diet.     Call Your Doctor If Any of These Occur Redness, drainage, or swelling at the wound.  Temperature greater than 101 degrees. Severe pain not relieved by pain medication. Incision starts to come apart. Follow Up Appt Call  [phone])  for problems.

## 2022-11-22 ENCOUNTER — Other Ambulatory Visit: Payer: Self-pay | Admitting: Physical Medicine & Rehabilitation

## 2022-11-22 DIAGNOSIS — M5442 Lumbago with sciatica, left side: Secondary | ICD-10-CM

## 2022-11-23 ENCOUNTER — Ambulatory Visit
Admission: RE | Admit: 2022-11-23 | Discharge: 2022-11-23 | Disposition: A | Discharge status: Home / Self Care | Payer: Medicare HMO | Source: Ambulatory Visit | Attending: Physical Medicine & Rehabilitation | Admitting: Physical Medicine & Rehabilitation

## 2022-11-23 DIAGNOSIS — M5442 Lumbago with sciatica, left side: Secondary | ICD-10-CM | POA: Diagnosis present

## 2022-11-23 MED ORDER — GADOBUTROL 1 MMOL/ML IV SOLN
9.0000 mL | Freq: Once | INTRAVENOUS | Status: AC | PRN
  Administered 2022-11-23: 9 mL via INTRAVENOUS

## 2023-07-30 ENCOUNTER — Ambulatory Visit: Payer: Medicare HMO | Admitting: Urology

## 2023-08-24 ENCOUNTER — Other Ambulatory Visit: Payer: Self-pay | Admitting: Student

## 2023-08-24 ENCOUNTER — Encounter: Payer: Self-pay | Admitting: Student

## 2023-08-24 DIAGNOSIS — M5416 Radiculopathy, lumbar region: Secondary | ICD-10-CM

## 2023-09-05 NOTE — Discharge Instructions (Signed)

## 2023-09-10 ENCOUNTER — Ambulatory Visit
Admission: RE | Admit: 2023-09-10 | Discharge: 2023-09-10 | Disposition: A | Discharge status: Home / Self Care | Payer: Medicare HMO | Source: Ambulatory Visit | Attending: Student

## 2023-09-10 DIAGNOSIS — M5416 Radiculopathy, lumbar region: Secondary | ICD-10-CM

## 2023-09-10 MED ORDER — IOPAMIDOL (ISOVUE-M 200) INJECTION 41%
20.0000 mL | Freq: Once | INTRAMUSCULAR | Status: AC
Start: 2023-09-10 — End: 2023-09-10
  Administered 2023-09-10: 20 mL via INTRATHECAL

## 2023-09-10 MED ORDER — MEPERIDINE HCL 50 MG/ML IJ SOLN: 50.0000 mg | Freq: Once | INTRAMUSCULAR | Status: DC | PRN

## 2023-09-10 MED ORDER — DIAZEPAM 5 MG PO TABS
5.0000 mg | ORAL_TABLET | Freq: Once | ORAL | Status: AC
  Administered 2023-09-10: 5 mg via ORAL

## 2023-09-10 MED ORDER — ONDANSETRON HCL 4 MG/2ML IJ SOLN
4.0000 mg | Freq: Once | INTRAMUSCULAR | Status: DC | PRN
Start: 2023-09-10 — End: 2023-09-11

## 2023-10-25 DIAGNOSIS — M47816 Spondylosis without myelopathy or radiculopathy, lumbar region: Secondary | ICD-10-CM | POA: Diagnosis not present

## 2023-11-19 DIAGNOSIS — M47816 Spondylosis without myelopathy or radiculopathy, lumbar region: Secondary | ICD-10-CM | POA: Diagnosis not present

## 2023-12-20 DIAGNOSIS — Z6835 Body mass index (BMI) 35.0-35.9, adult: Secondary | ICD-10-CM | POA: Diagnosis not present

## 2023-12-20 DIAGNOSIS — M47816 Spondylosis without myelopathy or radiculopathy, lumbar region: Secondary | ICD-10-CM | POA: Diagnosis not present

## 2024-01-09 DIAGNOSIS — Z0000 Encounter for general adult medical examination without abnormal findings: Secondary | ICD-10-CM | POA: Diagnosis not present

## 2024-01-09 DIAGNOSIS — E7800 Pure hypercholesterolemia, unspecified: Secondary | ICD-10-CM | POA: Diagnosis not present

## 2024-01-09 DIAGNOSIS — E1122 Type 2 diabetes mellitus with diabetic chronic kidney disease: Secondary | ICD-10-CM | POA: Diagnosis not present

## 2024-01-09 DIAGNOSIS — N182 Chronic kidney disease, stage 2 (mild): Secondary | ICD-10-CM | POA: Diagnosis not present

## 2024-01-16 DIAGNOSIS — E1122 Type 2 diabetes mellitus with diabetic chronic kidney disease: Secondary | ICD-10-CM | POA: Diagnosis not present

## 2024-01-16 DIAGNOSIS — E538 Deficiency of other specified B group vitamins: Secondary | ICD-10-CM | POA: Diagnosis not present

## 2024-01-16 DIAGNOSIS — N1831 Chronic kidney disease, stage 3a: Secondary | ICD-10-CM | POA: Diagnosis not present

## 2024-01-16 DIAGNOSIS — Z6835 Body mass index (BMI) 35.0-35.9, adult: Secondary | ICD-10-CM | POA: Diagnosis not present

## 2024-01-16 DIAGNOSIS — E7800 Pure hypercholesterolemia, unspecified: Secondary | ICD-10-CM | POA: Diagnosis not present

## 2024-01-16 DIAGNOSIS — N182 Chronic kidney disease, stage 2 (mild): Secondary | ICD-10-CM | POA: Diagnosis not present

## 2024-01-16 DIAGNOSIS — J45909 Unspecified asthma, uncomplicated: Secondary | ICD-10-CM | POA: Diagnosis not present

## 2024-01-21 DIAGNOSIS — E7800 Pure hypercholesterolemia, unspecified: Secondary | ICD-10-CM | POA: Diagnosis not present

## 2024-01-21 DIAGNOSIS — N1831 Chronic kidney disease, stage 3a: Secondary | ICD-10-CM | POA: Diagnosis not present

## 2024-01-21 DIAGNOSIS — E1122 Type 2 diabetes mellitus with diabetic chronic kidney disease: Secondary | ICD-10-CM | POA: Diagnosis not present

## 2024-02-04 DIAGNOSIS — M5416 Radiculopathy, lumbar region: Secondary | ICD-10-CM | POA: Diagnosis not present

## 2024-02-04 DIAGNOSIS — M47816 Spondylosis without myelopathy or radiculopathy, lumbar region: Secondary | ICD-10-CM | POA: Diagnosis not present

## 2024-03-25 DIAGNOSIS — J45909 Unspecified asthma, uncomplicated: Secondary | ICD-10-CM | POA: Diagnosis not present

## 2024-03-25 DIAGNOSIS — E1122 Type 2 diabetes mellitus with diabetic chronic kidney disease: Secondary | ICD-10-CM | POA: Diagnosis not present

## 2024-03-25 DIAGNOSIS — N182 Chronic kidney disease, stage 2 (mild): Secondary | ICD-10-CM | POA: Diagnosis not present

## 2024-03-25 DIAGNOSIS — G8929 Other chronic pain: Secondary | ICD-10-CM | POA: Diagnosis not present

## 2024-03-25 DIAGNOSIS — M5441 Lumbago with sciatica, right side: Secondary | ICD-10-CM | POA: Diagnosis not present

## 2024-04-08 DIAGNOSIS — M5416 Radiculopathy, lumbar region: Secondary | ICD-10-CM | POA: Diagnosis not present

## 2024-04-08 DIAGNOSIS — Z6834 Body mass index (BMI) 34.0-34.9, adult: Secondary | ICD-10-CM | POA: Diagnosis not present

## 2024-04-15 DIAGNOSIS — Z6835 Body mass index (BMI) 35.0-35.9, adult: Secondary | ICD-10-CM | POA: Diagnosis not present

## 2024-04-15 DIAGNOSIS — M47816 Spondylosis without myelopathy or radiculopathy, lumbar region: Secondary | ICD-10-CM | POA: Diagnosis not present

## 2024-04-21 ENCOUNTER — Other Ambulatory Visit: Payer: Self-pay | Admitting: Neurosurgery

## 2024-05-05 DIAGNOSIS — M5416 Radiculopathy, lumbar region: Secondary | ICD-10-CM | POA: Diagnosis not present

## 2024-05-06 ENCOUNTER — Other Ambulatory Visit (HOSPITAL_COMMUNITY): Payer: Self-pay

## 2024-05-06 DIAGNOSIS — M5416 Radiculopathy, lumbar region: Secondary | ICD-10-CM | POA: Diagnosis not present

## 2024-05-06 NOTE — Progress Notes (Signed)
 Surgical Instructions   Your procedure is scheduled on Monday August 4. Report to [facility] Main Entrance A at 5:30 A.M., then check in with the Admitting office. Any questions or running late day of surgery: call [phone]  Questions prior to your surgery date: call [phone], Monday-Friday, 8am-4pm. If you experience any cold or flu symptoms such as cough, fever, chills, shortness of breath, etc. between now and your scheduled surgery, please notify us  at the above number.     Remember:  Do not eat or drink anything after midnight the night before your surgery      Take these medicines the morning of surgery with A SIP OF WATER  atorvastatin  (LIPITOR)   May take these medicines IF NEEDED: acetaminophen  (TYLENOL )  fluticasone  (FLONASE )  traMADol (ULTRAM)   One week prior to surgery, STOP taking any Aspirin (unless otherwise instructed by your surgeon) Aleve, Naproxen, Ibuprofen, Motrin, Advil, Goody's, BC's, all herbal medications, fish oil, and non-prescription vitamins.          WHAT DO I DO ABOUT MY DIABETES MEDICATION?   Do not take oral diabetes medicines (pills) the morning of surgery. DO NOT TAKE metFORMIN  (GLUCOPHAGE -XR) THE MORNING OF SURGERY.        HOLD empagliflozin  (JARDIANCE ) 72 HOURS PRIOR TO SURGERY.  TAKE YOUR LAST DOSE JULY 31.  HOW TO MANAGE YOUR DIABETES BEFORE AND AFTER SURGERY  Why is it important to control my blood sugar before and after surgery? Improving blood sugar levels before and after surgery helps healing and can limit problems. A way of improving blood sugar control is eating a healthy diet by:  Eating less sugar and carbohydrates  Increasing activity/exercise  Talking with your doctor about reaching your blood sugar goals High blood sugars (greater than 180 mg/dL) can raise your risk of infections and slow your recovery, so you will need to focus on controlling your diabetes during the weeks before surgery. Make sure that the  doctor who takes care of your diabetes knows about your planned surgery including the date and location.  How do I manage my blood sugar before surgery? Check your blood sugar at least 4 times a day, starting 2 days before surgery, to make sure that the level is not too high or low.  Check your blood sugar the morning of your surgery when you wake up and every 2 hours until you get to the Short Stay unit.  If your blood sugar is less than 70 mg/dL, you will need to treat for low blood sugar: Do not take insulin . Treat a low blood sugar (less than 70 mg/dL) with  cup of clear juice (cranberry or apple), 4 glucose tablets, OR glucose gel. Recheck blood sugar in 15 minutes after treatment (to make sure it is greater than 70 mg/dL). If your blood sugar is not greater than 70 mg/dL on recheck, call [phone] for further instructions. Report your blood sugar to the short stay nurse when you get to Short Stay.  If you are admitted to the hospital after surgery: Your blood sugar will be checked by the staff and you will probably be given insulin  after surgery (instead of oral diabetes medicines) to make sure you have good blood sugar levels. The goal for blood sugar control after surgery is 80-180 mg/dL.            Do NOT Smoke (Tobacco/Vaping) for 24 hours prior to your procedure.  If you use a CPAP at night, you may bring your mask/headgear  for your overnight stay.   You will be asked to remove any contacts, glasses, piercing's, hearing aid's, dentures/partials prior to surgery. Please bring cases for these items if needed.    Patients discharged the day of surgery will not be allowed to drive home, and someone needs to stay with them for 24 hours.  SURGICAL WAITING ROOM VISITATION Patients may have no more than 2 support people in the waiting area - these visitors may rotate.   Pre-op nurse will coordinate an appropriate time for 1 ADULT support person, who may not rotate, to accompany  patient in pre-op.  Children under the age of 28 must have an adult with them who is not the patient and must remain in the main waiting area with an adult.  If the patient needs to stay at the hospital during part of their recovery, the visitor guidelines for inpatient rooms apply.  Please refer to the [facility] website for the visitor guidelines for any additional information.   If you received a COVID test during your pre-op visit  it is requested that you wear a mask when out in public, stay away from anyone that may not be feeling well and notify your surgeon if you develop symptoms. If you have been in contact with anyone that has tested positive in the last 10 days please notify you surgeon.      Pre-operative 5 CHG Bathing Instructions   You can play a key role in reducing the risk of infection after surgery. Your skin needs to be as free of germs as possible. You can reduce the number of germs on your skin by washing with CHG (chlorhexidine  gluconate) soap before surgery. CHG is an antiseptic soap that kills germs and continues to kill germs even after washing.   DO NOT use if you have an allergy to chlorhexidine /CHG or antibacterial soaps. If your skin becomes reddened or irritated, stop using the CHG and notify one of our RNs at [phone].   Please shower with the CHG soap starting 4 days before surgery using the following schedule:     Please keep in mind the following:  DO NOT shave, including legs and underarms, starting the day of your first shower.   You may shave your face at any point before/day of surgery.  Place clean sheets on your bed the day you start using CHG soap. Use a clean washcloth (not used since being washed) for each shower. DO NOT sleep with pets once you start using the CHG.   CHG Shower Instructions:  Wash your face and private area with normal soap. If you choose to wash your hair, wash first with your normal shampoo.  After you use  shampoo/soap, rinse your hair and body thoroughly to remove shampoo/soap residue.  Turn the water OFF and apply about 3 tablespoons (45 ml) of CHG soap to a CLEAN washcloth.  Apply CHG soap ONLY FROM YOUR NECK DOWN TO YOUR TOES (washing for 3-5 minutes)  DO NOT use CHG soap on face, private areas, open wounds, or sores.  Pay special attention to the area where your surgery is being performed.  If you are having back surgery, having someone wash your back for you may be helpful. Wait 2 minutes after CHG soap is applied, then you may rinse off the CHG soap.  Pat dry with a clean towel  Put on clean clothes/pajamas   If you choose to wear lotion, please use ONLY the CHG-compatible lotions that are listed  below.  Additional instructions for the day of surgery: DO NOT APPLY any lotions, deodorants, cologne, or perfumes.   Do not bring valuables to the hospital. [facility] is not responsible for any belongings/valuables. Do not wear nail polish, gel polish, artificial nails, or any other type of covering on natural nails (fingers and toes) Do not wear jewelry or makeup Put on clean/comfortable clothes.  Please brush your teeth.  Ask your nurse before applying any prescription medications to the skin.     CHG Compatible Lotions   Aveeno Moisturizing lotion  Cetaphil Moisturizing Cream  Cetaphil Moisturizing Lotion  Clairol Herbal Essence Moisturizing Lotion, Dry Skin  Clairol Herbal Essence Moisturizing Lotion, Extra Dry Skin  Clairol Herbal Essence Moisturizing Lotion, Normal Skin  Curel Age Defying Therapeutic Moisturizing Lotion with Alpha Hydroxy  Curel Extreme Care Body Lotion  Curel Soothing Hands Moisturizing Hand Lotion  Curel Therapeutic Moisturizing Cream, Fragrance-Free  Curel Therapeutic Moisturizing Lotion, Fragrance-Free  Curel Therapeutic Moisturizing Lotion, Original Formula  Eucerin Daily Replenishing Lotion  Eucerin Dry Skin Therapy Plus Alpha Hydroxy Crme  Eucerin  Dry Skin Therapy Plus Alpha Hydroxy Lotion  Eucerin Original Crme  Eucerin Original Lotion  Eucerin Plus Crme Eucerin Plus Lotion  Eucerin TriLipid Replenishing Lotion  Keri Anti-Bacterial Hand Lotion  Keri Deep Conditioning Original Lotion Dry Skin Formula Softly Scented  Keri Deep Conditioning Original Lotion, Fragrance Free Sensitive Skin Formula  Keri Lotion Fast Absorbing Fragrance Free Sensitive Skin Formula  Keri Lotion Fast Absorbing Softly Scented Dry Skin Formula  Keri Original Lotion  Keri Skin Renewal Lotion Keri Silky Smooth Lotion  Keri Silky Smooth Sensitive Skin Lotion  Nivea Body Creamy Conditioning Oil  Nivea Body Extra Enriched Lotion  Nivea Body Original Lotion  Nivea Body Sheer Moisturizing Lotion Nivea Crme  Nivea Skin Firming Lotion  NutraDerm 30 Skin Lotion  NutraDerm Skin Lotion  NutraDerm Therapeutic Skin Cream  NutraDerm Therapeutic Skin Lotion  ProShield Protective Hand Cream  Provon moisturizing lotion  Please read over the following fact sheets that you were given.

## 2024-05-07 ENCOUNTER — Encounter (HOSPITAL_COMMUNITY)
Admission: RE | Admit: 2024-05-07 | Discharge: 2024-05-07 | Disposition: A | Discharge status: Home / Self Care | Payer: Self-pay | Source: Ambulatory Visit | Attending: Neurosurgery | Admitting: Neurosurgery

## 2024-05-07 ENCOUNTER — Other Ambulatory Visit: Payer: Self-pay

## 2024-05-07 ENCOUNTER — Encounter (HOSPITAL_COMMUNITY): Payer: Self-pay

## 2024-05-07 VITALS — BP 142/77 | HR 85 | Temp 97.7°F | Resp 17 | Ht 61.0 in | Wt 189.0 lb

## 2024-05-07 DIAGNOSIS — Z01818 Encounter for other preprocedural examination: Secondary | ICD-10-CM | POA: Insufficient documentation

## 2024-05-07 DIAGNOSIS — E119 Type 2 diabetes mellitus without complications: Secondary | ICD-10-CM | POA: Insufficient documentation

## 2024-05-07 HISTORY — DX: Unspecified osteoarthritis, unspecified site: M19.90

## 2024-05-07 LAB — HEMOGLOBIN A1C
Hgb A1c MFr Bld: 6.5 % — ABNORMAL HIGH (ref 4.8–5.6)
Mean Plasma Glucose: 139.85 mg/dL

## 2024-05-07 LAB — BASIC METABOLIC PANEL WITH GFR
Anion gap: 14 (ref 5–15)
BUN: 16 mg/dL (ref 8–23)
CO2: 19 mmol/L — ABNORMAL LOW (ref 22–32)
Calcium: 9.6 mg/dL (ref 8.9–10.3)
Chloride: 105 mmol/L (ref 98–111)
Creatinine, Ser: 0.91 mg/dL (ref 0.44–1.00)
GFR, Estimated: >60 mL/min (ref >60)
Glucose, Bld: 120 mg/dL — ABNORMAL HIGH (ref 70–99)
Potassium: 4.3 mmol/L (ref 3.5–5.1)
Sodium: 138 mmol/L (ref 135–145)

## 2024-05-07 LAB — CBC
HCT: 43.5 % (ref 36.0–46.0)
Hemoglobin: 13.6 g/dL (ref 12.0–15.0)
MCH: 29.2 pg (ref 26.0–34.0)
MCHC: 31.3 g/dL (ref 30.0–36.0)
MCV: 93.5 fL (ref 80.0–100.0)
Platelets: 282 K/uL (ref 150–400)
RBC: 4.65 MIL/uL (ref 3.87–5.11)
RDW: 12.7 % (ref 11.5–15.5)
WBC: 9.4 K/uL (ref 4.0–10.5)
nRBC: 0 % (ref 0.0–0.2)

## 2024-05-07 LAB — SURGICAL PCR SCREEN
MRSA, PCR: NEGATIVE
Staphylococcus aureus: NEGATIVE

## 2024-05-07 LAB — TYPE AND SCREEN
ABO/RH(D): O NEG
Antibody Screen: NEGATIVE

## 2024-05-07 LAB — GLUCOSE, CAPILLARY: Glucose-Capillary: 135 mg/dL — ABNORMAL HIGH (ref 70–99)

## 2024-05-07 NOTE — Progress Notes (Signed)
 PCP - Dr. Layman Piety Cardiologist -  denies  PPM/ICD - denies Device Orders - na Rep Notified - na  Chest x-ray - na EKG - PAT, 05/07/2024 Stress Test -  ECHO - 06/24/2021 - CE Cardiac Cath -   Sleep Study - denies CPAP - na  Type II diabetic.  Blood sugar 135 at PAT appointment.  A1C 7.4 on 01/09/2024, repeat drawn today at PAT appointment Fasting Blood Sugar - doesn't check blood sugar Checks Blood Sugar - doesn't check blood sugar  Last dose of GLP1 agonist-  denies GLP1 instructions: na  Blood Thinner Instructions: denies Aspirin Instructions:denies  ERAS Protcol - NPO  Anesthesia review: Yes. HTN, DM, heart murmur  Patient denies shortness of breath, fever, cough and chest pain at PAT appointment   All instructions explained to the patient, with a verbal understanding of the material. Patient agrees to go over the instructions while at home for a better understanding. Patient also instructed to self quarantine after being tested for COVID-19. The opportunity to ask questions was provided.

## 2024-05-08 NOTE — Progress Notes (Signed)
 Anesthesia Chart Review:  78 year old female with pertinent history including PONV, HTN, asthma, non-insulin -dependent DM2, CKD 2, GERD.  Patient reports questionable history of heart murmur.  She had an echocardiogram 06/24/2021 which showed normal biventricular function, grade 1 DD, mild aortic regurgitation.  Seen by PCP Dr. Layman Piety on 03/25/2024 for preop evaluation.  Per note, Worsening leg weakness with walkign and unstable lumbar myelogram. Seems reasonable to proceed with surgery, moderate risk for a necessary surgery.  Preop labs reviewed, unremarkable.  DM2 well-controlled with A1c 6.5.  EKG 05/07/2024: Normal sinus rhythm.  Rate 82. Low voltage QRS. Cannot rule out Anterior infarct , age undetermined.  TTE 06/24/2021: INTERPRETATION  NORMAL LEFT VENTRICULAR SYSTOLIC FUNCTION  NORMAL RIGHT VENTRICULAR SYSTOLIC FUNCTION  MILD VALVULAR REGURGITATION (See above)  NO VALVULAR STENOSIS      Lynwood Geofm RIGGERS [facility] Short Stay Center/Anesthesiology Phone [phone] 05/08/2024 2:39 PM

## 2024-05-08 NOTE — Anesthesia Preprocedure Evaluation (Addendum)
 Anesthesia Evaluation  Patient identified by MRN, date of birth, ID band Patient awake    Reviewed: Allergy & Precautions, NPO status , Patient's Chart, lab work & pertinent test results  History of Anesthesia Complications (+) PONV and history of anesthetic complications  Airway Mallampati: II  TM Distance: >3 FB Neck ROM: Full    Dental  (+) Dental Advisory Given   Pulmonary neg pulmonary ROS   Pulmonary exam normal        Cardiovascular hypertension, Pt. on medications Normal cardiovascular exam     Neuro/Psych  Headaches  negative psych ROS   GI/Hepatic Neg liver ROS,GERD  Controlled,,  Endo/Other  diabetes, Type 2, Oral Hypoglycemic Agents   Obesity   Renal/GU negative Renal ROS     Musculoskeletal  (+) Arthritis ,    Abdominal   Peds  Hematology negative hematology ROS (+)   Anesthesia Other Findings   Reproductive/Obstetrics                              Anesthesia Physical Anesthesia Plan  ASA: 3  Anesthesia Plan: General   Post-op Pain Management: Tylenol  PO (pre-op)*   Induction: Intravenous  PONV Risk Score and Plan: 4 or greater and Treatment may vary due to age or medical condition, Ondansetron  and TIVA  Airway Management Planned: Oral ETT  Additional Equipment: None  Intra-op Plan:   Post-operative Plan: Extubation in OR  Informed Consent: I have reviewed the patients History and Physical, chart, labs and discussed the procedure including the risks, benefits and alternatives for the proposed anesthesia with the patient or authorized representative who has indicated his/her understanding and acceptance.     Dental advisory given  Plan Discussed with: CRNA and Anesthesiologist  Anesthesia Plan Comments: (PAT note by Lynwood Hope, PA-C)         Anesthesia Quick Evaluation

## 2024-05-12 ENCOUNTER — Other Ambulatory Visit: Payer: Self-pay

## 2024-05-12 ENCOUNTER — Encounter (HOSPITAL_COMMUNITY): Payer: Self-pay | Admitting: Neurosurgery

## 2024-05-12 ENCOUNTER — Inpatient Hospital Stay (HOSPITAL_COMMUNITY): Payer: Self-pay | Admitting: Anesthesiology

## 2024-05-12 ENCOUNTER — Inpatient Hospital Stay (HOSPITAL_COMMUNITY)
Admission: RE | Admit: 2024-05-12 | Discharge: 2024-05-14 | DRG: 448 | Disposition: A | Discharge status: Skilled Nursing Facility | Payer: Self-pay | Attending: Neurosurgery | Admitting: Neurosurgery

## 2024-05-12 ENCOUNTER — Inpatient Hospital Stay (HOSPITAL_COMMUNITY): Payer: Self-pay | Admitting: Physician Assistant

## 2024-05-12 ENCOUNTER — Inpatient Hospital Stay (HOSPITAL_COMMUNITY)

## 2024-05-12 ENCOUNTER — Encounter (HOSPITAL_COMMUNITY)
Admission: RE | Disposition: A | Discharge status: Skilled Nursing Facility | Payer: Self-pay | Source: Home / Self Care | Attending: Neurosurgery

## 2024-05-12 DIAGNOSIS — M4316 Spondylolisthesis, lumbar region: Principal | ICD-10-CM | POA: Diagnosis present

## 2024-05-12 DIAGNOSIS — Z79899 Other long term (current) drug therapy: Secondary | ICD-10-CM | POA: Diagnosis not present

## 2024-05-12 DIAGNOSIS — E119 Type 2 diabetes mellitus without complications: Secondary | ICD-10-CM | POA: Diagnosis not present

## 2024-05-12 DIAGNOSIS — M5416 Radiculopathy, lumbar region: Secondary | ICD-10-CM | POA: Diagnosis not present

## 2024-05-12 DIAGNOSIS — J45909 Unspecified asthma, uncomplicated: Secondary | ICD-10-CM | POA: Diagnosis present

## 2024-05-12 DIAGNOSIS — M5116 Intervertebral disc disorders with radiculopathy, lumbar region: Secondary | ICD-10-CM | POA: Diagnosis present

## 2024-05-12 DIAGNOSIS — Z86000 Personal history of in-situ neoplasm of breast: Secondary | ICD-10-CM | POA: Diagnosis not present

## 2024-05-12 DIAGNOSIS — Z9842 Cataract extraction status, left eye: Secondary | ICD-10-CM | POA: Diagnosis not present

## 2024-05-12 DIAGNOSIS — Z961 Presence of intraocular lens: Secondary | ICD-10-CM | POA: Diagnosis present

## 2024-05-12 DIAGNOSIS — R262 Difficulty in walking, not elsewhere classified: Secondary | ICD-10-CM | POA: Diagnosis not present

## 2024-05-12 DIAGNOSIS — M6281 Muscle weakness (generalized): Secondary | ICD-10-CM | POA: Diagnosis not present

## 2024-05-12 DIAGNOSIS — M48061 Spinal stenosis, lumbar region without neurogenic claudication: Principal | ICD-10-CM | POA: Diagnosis present

## 2024-05-12 DIAGNOSIS — Z882 Allergy status to sulfonamides status: Secondary | ICD-10-CM | POA: Diagnosis not present

## 2024-05-12 DIAGNOSIS — Z885 Allergy status to narcotic agent status: Secondary | ICD-10-CM | POA: Diagnosis not present

## 2024-05-12 DIAGNOSIS — Z4789 Encounter for other orthopedic aftercare: Secondary | ICD-10-CM | POA: Diagnosis not present

## 2024-05-12 DIAGNOSIS — R600 Localized edema: Secondary | ICD-10-CM | POA: Diagnosis not present

## 2024-05-12 DIAGNOSIS — M48062 Spinal stenosis, lumbar region with neurogenic claudication: Secondary | ICD-10-CM | POA: Diagnosis present

## 2024-05-12 DIAGNOSIS — Z9841 Cataract extraction status, right eye: Secondary | ICD-10-CM | POA: Diagnosis not present

## 2024-05-12 DIAGNOSIS — Z7984 Long term (current) use of oral hypoglycemic drugs: Secondary | ICD-10-CM

## 2024-05-12 DIAGNOSIS — E785 Hyperlipidemia, unspecified: Secondary | ICD-10-CM | POA: Diagnosis not present

## 2024-05-12 DIAGNOSIS — I10 Essential (primary) hypertension: Secondary | ICD-10-CM

## 2024-05-12 DIAGNOSIS — Z741 Need for assistance with personal care: Secondary | ICD-10-CM | POA: Diagnosis not present

## 2024-05-12 DIAGNOSIS — J302 Other seasonal allergic rhinitis: Secondary | ICD-10-CM | POA: Diagnosis not present

## 2024-05-12 DIAGNOSIS — Z981 Arthrodesis status: Secondary | ICD-10-CM | POA: Diagnosis not present

## 2024-05-12 DIAGNOSIS — R2689 Other abnormalities of gait and mobility: Secondary | ICD-10-CM | POA: Diagnosis not present

## 2024-05-12 DIAGNOSIS — K5900 Constipation, unspecified: Secondary | ICD-10-CM | POA: Diagnosis not present

## 2024-05-12 LAB — GLUCOSE, CAPILLARY
Glucose-Capillary: 114 mg/dL — ABNORMAL HIGH (ref 70–99)
Glucose-Capillary: 172 mg/dL — ABNORMAL HIGH (ref 70–99)
Glucose-Capillary: 187 mg/dL — ABNORMAL HIGH (ref 70–99)
Glucose-Capillary: 208 mg/dL — ABNORMAL HIGH (ref 70–99)

## 2024-05-12 LAB — ABO/RH: ABO/RH(D): O NEG

## 2024-05-12 SURGERY — POSTERIOR LUMBAR FUSION 2 LEVEL
Anesthesia: General | Site: Back

## 2024-05-12 MED ORDER — LOSARTAN POTASSIUM 50 MG PO TABS
50.0000 mg | ORAL_TABLET | Freq: Every morning | ORAL | Status: DC
  Administered 2024-05-13 – 2024-05-14 (×2): 50 mg via ORAL
  Filled 2024-05-12 (×2): qty 1

## 2024-05-12 MED ORDER — ACETAMINOPHEN 500 MG PO TABS
1000.0000 mg | ORAL_TABLET | Freq: Once | ORAL | Status: AC
  Administered 2024-05-12: 1000 mg via ORAL
  Filled 2024-05-12: qty 2

## 2024-05-12 MED ORDER — ACETAMINOPHEN 650 MG RE SUPP
650.0000 mg | RECTAL | Status: DC | PRN
Start: 2024-05-12 — End: 2024-05-14

## 2024-05-12 MED ORDER — ROCURONIUM BROMIDE 10 MG/ML (PF) SYRINGE
PREFILLED_SYRINGE | INTRAVENOUS | Status: DC | PRN
  Administered 2024-05-12: 10 mg via INTRAVENOUS
  Administered 2024-05-12 (×2): 30 mg via INTRAVENOUS
  Administered 2024-05-12: 50 mg via INTRAVENOUS
  Administered 2024-05-12: 20 mg via INTRAVENOUS
  Administered 2024-05-12: 5 mg via INTRAVENOUS

## 2024-05-12 MED ORDER — ALBUMIN HUMAN 5 % IV SOLN
INTRAVENOUS | Status: DC | PRN
Start: 2024-05-12 — End: 2024-05-12

## 2024-05-12 MED ORDER — ONDANSETRON HCL 4 MG PO TABS: 4.0000 mg | ORAL_TABLET | Freq: Four times a day (QID) | ORAL | Status: DC | PRN

## 2024-05-12 MED ORDER — SUGAMMADEX SODIUM 200 MG/2ML IV SOLN
INTRAVENOUS | Status: DC | PRN
  Administered 2024-05-12: 200 mg via INTRAVENOUS

## 2024-05-12 MED ORDER — THROMBIN 20000 UNITS EX SOLR
CUTANEOUS | Status: AC
  Filled 2024-05-12: qty 20000

## 2024-05-12 MED ORDER — LACTATED RINGERS IV SOLN: INTRAVENOUS | Status: DC

## 2024-05-12 MED ORDER — LIDOCAINE 2% (20 MG/ML) 5 ML SYRINGE
INTRAMUSCULAR | Status: DC | PRN
  Administered 2024-05-12: 60 mg via INTRAVENOUS

## 2024-05-12 MED ORDER — PANTOPRAZOLE SODIUM 40 MG IV SOLR: 40.0000 mg | Freq: Every day | INTRAVENOUS | Status: DC

## 2024-05-12 MED ORDER — SODIUM CHLORIDE 0.9% FLUSH
3.0000 mL | Freq: Two times a day (BID) | INTRAVENOUS | Status: DC
  Administered 2024-05-12 – 2024-05-13 (×3): 3 mL via INTRAVENOUS

## 2024-05-12 MED ORDER — PANTOPRAZOLE SODIUM 40 MG PO TBEC
40.0000 mg | DELAYED_RELEASE_TABLET | Freq: Every day | ORAL | Status: DC
  Administered 2024-05-12 – 2024-05-13 (×2): 40 mg via ORAL
  Filled 2024-05-12 (×2): qty 1

## 2024-05-12 MED ORDER — METFORMIN HCL ER 500 MG PO TB24
1500.0000 mg | ORAL_TABLET | Freq: Every day | ORAL | Status: DC
  Administered 2024-05-12 – 2024-05-13 (×2): 1500 mg via ORAL
  Filled 2024-05-12 (×2): qty 3

## 2024-05-12 MED ORDER — PHENYLEPHRINE HCL-NACL 20-0.9 MG/250ML-% IV SOLN
INTRAVENOUS | Status: DC | PRN
  Administered 2024-05-12: 20 ug/min via INTRAVENOUS

## 2024-05-12 MED ORDER — THROMBIN 20000 UNITS EX SOLR: CUTANEOUS | Status: DC | PRN

## 2024-05-12 MED ORDER — CHLORHEXIDINE GLUCONATE 0.12 % MT SOLN
15.0000 mL | Freq: Once | OROMUCOSAL | Status: AC
  Administered 2024-05-12: 15 mL via OROMUCOSAL
  Filled 2024-05-12: qty 15

## 2024-05-12 MED ORDER — ORAL CARE MOUTH RINSE: 15.0000 mL | Freq: Once | OROMUCOSAL | Status: AC

## 2024-05-12 MED ORDER — PROPOFOL 10 MG/ML IV BOLUS
INTRAVENOUS | Status: AC
  Filled 2024-05-12: qty 20

## 2024-05-12 MED ORDER — INSULIN ASPART 100 UNIT/ML IJ SOLN: 0.0000 [IU] | Freq: Three times a day (TID) | INTRAMUSCULAR | Status: DC

## 2024-05-12 MED ORDER — SODIUM CHLORIDE 0.9% FLUSH: 3.0000 mL | INTRAVENOUS | Status: DC | PRN

## 2024-05-12 MED ORDER — FENTANYL CITRATE (PF) 100 MCG/2ML IJ SOLN
INTRAMUSCULAR | Status: AC
Start: 2024-05-12 — End: 2024-05-12
  Filled 2024-05-12: qty 2

## 2024-05-12 MED ORDER — DEXAMETHASONE SODIUM PHOSPHATE 10 MG/ML IJ SOLN
INTRAMUSCULAR | Status: AC
Start: 2024-05-12 — End: 2024-05-12
  Filled 2024-05-12: qty 1

## 2024-05-12 MED ORDER — FENTANYL CITRATE (PF) 250 MCG/5ML IJ SOLN
INTRAMUSCULAR | Status: AC
  Filled 2024-05-12: qty 5

## 2024-05-12 MED ORDER — FLUTICASONE PROPIONATE 50 MCG/ACT NA SUSP: 2.0000 | Freq: Every day | NASAL | Status: DC | PRN

## 2024-05-12 MED ORDER — CEFAZOLIN SODIUM-DEXTROSE 2-4 GM/100ML-% IV SOLN
2.0000 g | INTRAVENOUS | Status: AC
  Administered 2024-05-12: 2 g via INTRAVENOUS
  Filled 2024-05-12: qty 100

## 2024-05-12 MED ORDER — CHLORHEXIDINE GLUCONATE CLOTH 2 % EX PADS: 6.0000 | MEDICATED_PAD | Freq: Once | CUTANEOUS | Status: DC

## 2024-05-12 MED ORDER — CEFAZOLIN SODIUM-DEXTROSE 2-4 GM/100ML-% IV SOLN
2.0000 g | Freq: Three times a day (TID) | INTRAVENOUS | Status: AC
  Administered 2024-05-12 – 2024-05-13 (×4): 2 g via INTRAVENOUS
  Filled 2024-05-12 (×5): qty 100

## 2024-05-12 MED ORDER — ONDANSETRON HCL 4 MG/2ML IJ SOLN
INTRAMUSCULAR | Status: DC | PRN
  Administered 2024-05-12: 4 mg via INTRAVENOUS

## 2024-05-12 MED ORDER — PROPOFOL 10 MG/ML IV BOLUS
INTRAVENOUS | Status: DC | PRN
  Administered 2024-05-12: 120 mg via INTRAVENOUS

## 2024-05-12 MED ORDER — ACETAMINOPHEN 500 MG PO TABS: 1000.0000 mg | ORAL_TABLET | Freq: Three times a day (TID) | ORAL | Status: DC | PRN

## 2024-05-12 MED ORDER — ROCURONIUM BROMIDE 10 MG/ML (PF) SYRINGE
PREFILLED_SYRINGE | INTRAVENOUS | Status: AC
  Filled 2024-05-12: qty 10

## 2024-05-12 MED ORDER — HYDROMORPHONE HCL 1 MG/ML IJ SOLN
INTRAMUSCULAR | Status: DC | PRN
  Administered 2024-05-12 (×2): .25 mg via INTRAVENOUS

## 2024-05-12 MED ORDER — SODIUM CHLORIDE 0.9 % IV SOLN
250.0000 mL | INTRAVENOUS | Status: AC
  Administered 2024-05-13: 250 mL via INTRAVENOUS

## 2024-05-12 MED ORDER — THROMBIN 5000 UNITS EX KIT
PACK | CUTANEOUS | Status: AC
  Filled 2024-05-12: qty 1

## 2024-05-12 MED ORDER — HYDROMORPHONE HCL 1 MG/ML IJ SOLN
0.5000 mg | INTRAMUSCULAR | Status: DC | PRN
  Administered 2024-05-12: 0.5 mg via INTRAVENOUS
  Filled 2024-05-12: qty 0.5

## 2024-05-12 MED ORDER — FLUCONAZOLE 200 MG PO TABS
200.0000 mg | ORAL_TABLET | Freq: Once | ORAL | Status: AC
  Administered 2024-05-12: 200 mg via ORAL
  Filled 2024-05-12: qty 1

## 2024-05-12 MED ORDER — MIDAZOLAM HCL 2 MG/2ML IJ SOLN
INTRAMUSCULAR | Status: AC
  Filled 2024-05-12: qty 2

## 2024-05-12 MED ORDER — MIDAZOLAM HCL 2 MG/2ML IJ SOLN
INTRAMUSCULAR | Status: DC | PRN
  Administered 2024-05-12: 1 mg via INTRAVENOUS

## 2024-05-12 MED ORDER — INSULIN ASPART 100 UNIT/ML IJ SOLN: 0.0000 [IU] | INTRAMUSCULAR | Status: DC | PRN

## 2024-05-12 MED ORDER — BUPIVACAINE LIPOSOME 1.3 % IJ SUSP
INTRAMUSCULAR | Status: DC | PRN
  Administered 2024-05-12: 20 mL

## 2024-05-12 MED ORDER — FLUCONAZOLE 200 MG PO TABS
200.0000 mg | ORAL_TABLET | Freq: Every day | ORAL | Status: DC
  Administered 2024-05-13 – 2024-05-14 (×2): 200 mg via ORAL
  Filled 2024-05-12 (×2): qty 1

## 2024-05-12 MED ORDER — FENTANYL CITRATE (PF) 250 MCG/5ML IJ SOLN
INTRAMUSCULAR | Status: DC | PRN
  Administered 2024-05-12: 50 ug via INTRAVENOUS
  Administered 2024-05-12: 100 ug via INTRAVENOUS
  Administered 2024-05-12 (×2): 50 ug via INTRAVENOUS

## 2024-05-12 MED ORDER — ACETAMINOPHEN 325 MG PO TABS: 650.0000 mg | ORAL_TABLET | ORAL | Status: DC | PRN

## 2024-05-12 MED ORDER — ATORVASTATIN CALCIUM 10 MG PO TABS
20.0000 mg | ORAL_TABLET | Freq: Every morning | ORAL | Status: DC
  Administered 2024-05-13 – 2024-05-14 (×2): 20 mg via ORAL
  Filled 2024-05-12 (×2): qty 2

## 2024-05-12 MED ORDER — TURMERIC CURCUMIN PO CAPS: ORAL_CAPSULE | Freq: Every day | ORAL | Status: DC

## 2024-05-12 MED ORDER — DEXAMETHASONE SODIUM PHOSPHATE 10 MG/ML IJ SOLN
INTRAMUSCULAR | Status: DC | PRN
  Administered 2024-05-12: 5 mg via INTRAVENOUS

## 2024-05-12 MED ORDER — LIDOCAINE 2% (20 MG/ML) 5 ML SYRINGE
INTRAMUSCULAR | Status: AC
  Filled 2024-05-12: qty 5

## 2024-05-12 MED ORDER — HYDROCODONE-ACETAMINOPHEN 5-325 MG PO TABS
2.0000 | ORAL_TABLET | ORAL | Status: DC | PRN
  Administered 2024-05-12 – 2024-05-13 (×3): 2 via ORAL
  Administered 2024-05-13: 1 via ORAL
  Administered 2024-05-13 – 2024-05-14 (×4): 2 via ORAL
  Filled 2024-05-12 (×8): qty 2

## 2024-05-12 MED ORDER — OXYCODONE HCL 5 MG/5ML PO SOLN: 5.0000 mg | Freq: Once | ORAL | Status: DC | PRN

## 2024-05-12 MED ORDER — PHENOL 1.4 % MT LIQD: 1.0000 | OROMUCOSAL | Status: DC | PRN

## 2024-05-12 MED ORDER — ALUM & MAG HYDROXIDE-SIMETH 200-200-20 MG/5ML PO SUSP: 30.0000 mL | Freq: Four times a day (QID) | ORAL | Status: DC | PRN

## 2024-05-12 MED ORDER — SENNA 8.6 MG PO TABS
1.0000 | ORAL_TABLET | Freq: Two times a day (BID) | ORAL | Status: DC | PRN
  Administered 2024-05-12: 8.6 mg via ORAL
  Filled 2024-05-12: qty 1

## 2024-05-12 MED ORDER — EPHEDRINE SULFATE-NACL 50-0.9 MG/10ML-% IV SOSY
PREFILLED_SYRINGE | INTRAVENOUS | Status: DC | PRN
  Administered 2024-05-12: 10 mg via INTRAVENOUS

## 2024-05-12 MED ORDER — INSULIN ASPART 100 UNIT/ML IJ SOLN
0.0000 [IU] | Freq: Three times a day (TID) | INTRAMUSCULAR | Status: DC
  Administered 2024-05-12: 3 [IU] via SUBCUTANEOUS

## 2024-05-12 MED ORDER — CYCLOBENZAPRINE HCL 10 MG PO TABS
10.0000 mg | ORAL_TABLET | Freq: Three times a day (TID) | ORAL | Status: DC | PRN
  Administered 2024-05-12 – 2024-05-13 (×3): 10 mg via ORAL
  Filled 2024-05-12 (×3): qty 1

## 2024-05-12 MED ORDER — LIDOCAINE-EPINEPHRINE 1 %-1:100000 IJ SOLN
INTRAMUSCULAR | Status: DC | PRN
  Administered 2024-05-12: 10 mL

## 2024-05-12 MED ORDER — 0.9 % SODIUM CHLORIDE (POUR BTL) OPTIME
TOPICAL | Status: DC | PRN
  Administered 2024-05-12: 1000 mL

## 2024-05-12 MED ORDER — FENTANYL CITRATE (PF) 100 MCG/2ML IJ SOLN
25.0000 ug | INTRAMUSCULAR | Status: DC | PRN
  Administered 2024-05-12: 25 ug via INTRAVENOUS

## 2024-05-12 MED ORDER — ONDANSETRON HCL 4 MG/2ML IJ SOLN: 4.0000 mg | Freq: Once | INTRAMUSCULAR | Status: DC | PRN

## 2024-05-12 MED ORDER — PROPOFOL 500 MG/50ML IV EMUL
INTRAVENOUS | Status: DC | PRN
  Administered 2024-05-12: 125 ug/kg/min via INTRAVENOUS

## 2024-05-12 MED ORDER — TORSEMIDE 20 MG PO TABS: 20.0000 mg | ORAL_TABLET | Freq: Every day | ORAL | Status: DC | PRN

## 2024-05-12 MED ORDER — EPHEDRINE 5 MG/ML INJ
INTRAVENOUS | Status: AC
  Filled 2024-05-12: qty 5

## 2024-05-12 MED ORDER — MENTHOL 3 MG MT LOZG: 1.0000 | LOZENGE | OROMUCOSAL | Status: DC | PRN

## 2024-05-12 MED ORDER — HYDROMORPHONE HCL 1 MG/ML IJ SOLN
INTRAMUSCULAR | Status: AC
Start: 2024-05-12 — End: 2024-05-12
  Filled 2024-05-12: qty 0.5

## 2024-05-12 MED ORDER — EMPAGLIFLOZIN 25 MG PO TABS
25.0000 mg | ORAL_TABLET | Freq: Every morning | ORAL | Status: DC
  Administered 2024-05-13 – 2024-05-14 (×2): 25 mg via ORAL
  Filled 2024-05-12 (×2): qty 1

## 2024-05-12 MED ORDER — ONDANSETRON HCL 4 MG/2ML IJ SOLN
INTRAMUSCULAR | Status: AC
Start: 2024-05-12 — End: 2024-05-12
  Filled 2024-05-12: qty 2

## 2024-05-12 MED ORDER — TRAMADOL HCL 50 MG PO TABS: 50.0000 mg | ORAL_TABLET | Freq: Two times a day (BID) | ORAL | Status: DC | PRN

## 2024-05-12 MED ORDER — OXYCODONE HCL 5 MG PO TABS: 5.0000 mg | ORAL_TABLET | Freq: Once | ORAL | Status: DC | PRN

## 2024-05-12 MED ORDER — DIPHENHYDRAMINE HCL 25 MG PO TABS
25.0000 mg | ORAL_TABLET | Freq: Every day | ORAL | Status: DC
Start: 2024-05-12 — End: 2024-05-14
  Administered 2024-05-12 – 2024-05-13 (×2): 25 mg via ORAL
  Filled 2024-05-12 (×5): qty 1

## 2024-05-12 MED ORDER — ONDANSETRON HCL 4 MG/2ML IJ SOLN: 4.0000 mg | Freq: Four times a day (QID) | INTRAMUSCULAR | Status: DC | PRN

## 2024-05-12 MED ORDER — DOCUSATE SODIUM 100 MG PO CAPS
100.0000 mg | ORAL_CAPSULE | Freq: Two times a day (BID) | ORAL | Status: DC | PRN
  Administered 2024-05-12 – 2024-05-13 (×3): 100 mg via ORAL
  Filled 2024-05-12 (×3): qty 1

## 2024-05-12 SURGICAL SUPPLY — 61 items
BAG COUNTER SPONGE SURGICOUNT (BAG) ×1 IMPLANT
BASKET BONE COLLECTION (BASKET) ×1 IMPLANT
BENZOIN TINCTURE PRP APPL 2/3 (GAUZE/BANDAGES/DRESSINGS) ×1 IMPLANT
BLADE BONE MILL MEDIUM (MISCELLANEOUS) ×1 IMPLANT
BLADE CLIPPER SURG (BLADE) IMPLANT
BLADE SURG 11 STRL SS (BLADE) ×1 IMPLANT
BUR CUTTER 7.0 ROUND (BURR) ×1 IMPLANT
BUR MATCHSTICK NEURO 3.0 LAGG (BURR) ×1 IMPLANT
CANISTER SUCTION 3000ML PPV (SUCTIONS) ×1 IMPLANT
CNTNR URN SCR LID CUP LEK RST (MISCELLANEOUS) ×1 IMPLANT
COVER BACK TABLE 60X90IN (DRAPES) ×1 IMPLANT
DERMABOND ADVANCED .7 DNX12 (GAUZE/BANDAGES/DRESSINGS) ×1 IMPLANT
DRAPE C-ARM 42X72 X-RAY (DRAPES) ×1 IMPLANT
DRAPE C-ARMOR (DRAPES) IMPLANT
DRAPE HALF SHEET 40X57 (DRAPES) IMPLANT
DRAPE LAPAROTOMY 100X72X124 (DRAPES) ×1 IMPLANT
DRAPE SURG 17X23 STRL (DRAPES) ×1 IMPLANT
DRSG OPSITE 4X5.5 SM (GAUZE/BANDAGES/DRESSINGS) ×1 IMPLANT
DRSG OPSITE POSTOP 4X6 (GAUZE/BANDAGES/DRESSINGS) ×1 IMPLANT
DURAPREP 26ML APPLICATOR (WOUND CARE) ×1 IMPLANT
ELECTRODE REM PT RTRN 9FT ADLT (ELECTROSURGICAL) ×1 IMPLANT
EVACUATOR 1/8 PVC DRAIN (DRAIN) ×1 IMPLANT
GAUZE 4X4 16PLY ~~LOC~~+RFID DBL (SPONGE) IMPLANT
GAUZE SPONGE 4X4 12PLY STRL (GAUZE/BANDAGES/DRESSINGS) ×1 IMPLANT
GLOVE BIO SURGEON STRL SZ7 (GLOVE) IMPLANT
GLOVE BIO SURGEON STRL SZ8 (GLOVE) ×2 IMPLANT
GLOVE BIOGEL PI IND STRL 7.0 (GLOVE) IMPLANT
GLOVE EXAM NITRILE XL STR (GLOVE) IMPLANT
GLOVE INDICATOR 8.5 STRL (GLOVE) ×2 IMPLANT
GOWN STRL REUS W/ TWL LRG LVL3 (GOWN DISPOSABLE) IMPLANT
GOWN STRL REUS W/ TWL XL LVL3 (GOWN DISPOSABLE) ×2 IMPLANT
GOWN STRL REUS W/TWL 2XL LVL3 (GOWN DISPOSABLE) IMPLANT
GRAFT BNE MATRIX VG FRMBL MD 5 (Bone Implant) IMPLANT
KIT BASIN OR (CUSTOM PROCEDURE TRAY) ×1 IMPLANT
KIT TURNOVER KIT B (KITS) ×1 IMPLANT
MILL BONE PREP (MISCELLANEOUS) ×1 IMPLANT
NDL HYPO 21X1.5 SAFETY (NEEDLE) ×1 IMPLANT
NDL HYPO 25X1 1.5 SAFETY (NEEDLE) ×1 IMPLANT
NEEDLE HYPO 21X1.5 SAFETY (NEEDLE) ×1 IMPLANT
NEEDLE HYPO 25X1 1.5 SAFETY (NEEDLE) ×1 IMPLANT
NS IRRIG 1000ML POUR BTL (IV SOLUTION) ×1 IMPLANT
PACK LAMINECTOMY NEURO (CUSTOM PROCEDURE TRAY) ×1 IMPLANT
PAD ARMBOARD POSITIONER FOAM (MISCELLANEOUS) ×3 IMPLANT
ROD LORD LIPPED TI 5.5X70 (Rod) IMPLANT
SCREW CANC SHANK 5X35 (Screw) IMPLANT
SCREW POLYAXIAL TULIP (Screw) IMPLANT
SCREW SHANK INV 5X30 (Screw) IMPLANT
SET SCREW SPNE (Screw) IMPLANT
SPACER IDENTITI PS 9X22X9 10D (Spacer) IMPLANT
SPIKE FLUID TRANSFER (MISCELLANEOUS) ×1 IMPLANT
SPONGE SURGIFOAM ABS GEL 100 (HEMOSTASIS) ×1 IMPLANT
SPONGE T-LAP 4X18 ~~LOC~~+RFID (SPONGE) IMPLANT
STRIP CLOSURE SKIN 1/2X4 (GAUZE/BANDAGES/DRESSINGS) ×2 IMPLANT
SUT VIC AB 0 CT1 18XCR BRD8 (SUTURE) ×1 IMPLANT
SUT VIC AB 2-0 CT1 18 (SUTURE) ×1 IMPLANT
SUT VIC AB 4-0 PS2 27 (SUTURE) ×1 IMPLANT
SYR 20ML LL LF (SYRINGE) ×1 IMPLANT
TOWEL GREEN STERILE (TOWEL DISPOSABLE) ×1 IMPLANT
TOWEL GREEN STERILE FF (TOWEL DISPOSABLE) ×1 IMPLANT
TRAY FOLEY MTR SLVR 16FR STAT (SET/KITS/TRAYS/PACK) ×1 IMPLANT
WATER STERILE IRR 1000ML POUR (IV SOLUTION) ×1 IMPLANT

## 2024-05-12 NOTE — H&P (Signed)
 Ruth Harris is an 78 y.o. female.   Chief Complaint: Back and left greater than right leg pain HPI: 78 year old female with severe back pain bilateral hip and leg pain workup revealed severe spinal stenosis L3-4 L4-5 due to patient's progression of clinical syndrome imaging findings of a cervical treatment I recommended decompression stabilization procedure at those levels.  I extensively reviewed the risks and benefits of the operation with patient as well as perioperative course expectations of outcome and alternatives to surgery and she understood and agreed to proceed forward.  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 No issues.   Hypertension    PONV (postoperative nausea and vomiting)    Seasonal allergies     Past Surgical History:  Procedure Laterality Date   BREAST SURGERY Right    DCIS, lumpectomy   CATARACT EXTRACTION W/PHACO Left 05/26/2015   Procedure: CATARACT EXTRACTION PHACO AND INTRAOCULAR LENS PLACEMENT (IOC);  Surgeon: Dene Etienne, MD;  Location: [facility];  Service: Ophthalmology;  Laterality: Left;  DIABETIC   CATARACT EXTRACTION W/PHACO Right 06/09/2015   Procedure: CATARACT EXTRACTION PHACO AND INTRAOCULAR LENS PLACEMENT (IOC);  Surgeon: Dene Etienne, MD;  Location: [facility];  Service: Ophthalmology;  Laterality: Right;  DIABETIC - oral meds   COLONOSCOPY WITH PROPOFOL  N/A 12/09/2015   Procedure: COLONOSCOPY WITH PROPOFOL ;  Surgeon: Lamar ONEIDA Holmes, MD;  Location[facility] ENDOSCOPY;  Service: Endoscopy;  Laterality: N/A;   HAMMER TOE SURGERY Right    LUMBAR LAMINECTOMY/DECOMPRESSION MICRODISCECTOMY Bilateral 07/07/2022   Procedure: Sublaminar decompression - L3-L4  - L4-L5 - bilateral;  Surgeon: Onetha Kuba, MD;  Location: [facility] OR;  Service: Neurosurgery;  Laterality: Bilateral;  3C   SHOULDER ARTHROSCOPY WITH OPEN ROTATOR CUFF REPAIR Left 12/19/2018   Procedure: LEFT SHOULDER ARTHROSCOPY WITH SUBACROMIAL DECOMPRESSION, DISTAL CLAVICLE EXCISION, MINI OPEN ROTATOR CUFF REPAIR;  Surgeon: Marchia Drivers, MD;  Location: [facility] ORS;  Service: Orthopedics;  Laterality: Left;   TOE SURGERY Right    corn removed from 5th toe   TONSILLECTOMY     TUBAL LIGATION      History reviewed. No pertinent family history. Social History:  reports that she has never smoked. She has never used smokeless tobacco. She reports that she does not drink alcohol and does not use drugs.  Allergies:  Allergies  Allergen Reactions   Sulfa Antibiotics Itching   Shellac Rash   Trazodone Rash    Medications Prior to Admission  Medication Sig Dispense Refill   acetaminophen  (TYLENOL ) 500 MG tablet Take 1,000 mg by mouth every 8 (eight) hours as needed for moderate pain (pain score 4-6).     atorvastatin  (LIPITOR) 20 MG tablet Take 20 mg by mouth in the morning.     diphenhydrAMINE  (BENADRYL ) 25 MG tablet Take 25 mg by mouth at bedtime.     empagliflozin  (JARDIANCE ) 25 MG TABS tablet Take 25 mg by mouth in the morning.     fluticasone  (FLONASE ) 50 MCG/ACT nasal spray Place 2 sprays into both nostrils daily as needed for allergies.     losartan  (COZAAR ) 25 MG tablet Take 50 mg by mouth in the morning.  metFORMIN  (GLUCOPHAGE -XR) 500 MG 24 hr tablet Take 1,500 mg by mouth every evening.     torsemide  (DEMADEX ) 20 MG tablet Take 20 mg by mouth daily as needed for fluid.     traMADol  (ULTRAM ) 50 MG tablet Take 50 mg by mouth every 12 (twelve) hours as needed for severe pain (pain score 7-10).     TURMERIC CURCUMIN PO Take 3 capsules by mouth daily.      Results for orders placed or performed during the hospital encounter of 05/12/24 (from the past 48 hours)  Glucose, capillary      Status: Abnormal   Collection Time: 05/12/24  6:13 AM  Result Value Ref Range   Glucose-Capillary 114 (H) 70 - 99 mg/dL    Comment: Glucose reference range applies only to samples taken after fasting for at least 8 hours.   No results found.  Review of Systems  Musculoskeletal:  Positive for back pain.  Neurological:  Positive for weakness and numbness.    Blood pressure (!) 133/56, pulse 87, temperature 97.6 F (36.4 C), resp. rate 18, height 5' 1 (1.549 m), weight 85.7 kg, SpO2 95%. Physical Exam HENT:     Head: Normocephalic.     Right Ear: Tympanic membrane normal.     Nose: Nose normal.     Mouth/Throat:     Mouth: Mucous membranes are moist.  Cardiovascular:     Rate and Rhythm: Normal rate.     Pulses: Normal pulses.  Pulmonary:     Effort: Pulmonary effort is normal.  Musculoskeletal:        General: Normal range of motion.     Cervical back: Normal range of motion.  Skin:    General: Skin is warm.  Neurological:     Mental Status: She is alert.     Comments: [name], quads, hamstrings, gastroc tibialis, [name]      Assessment/Plan 78 year old presents for L3-4 L4-5 posterior lumbar interbody fusion  Arley SHAUNNA Helling, MD 05/12/2024, 7:23 AM

## 2024-05-12 NOTE — Anesthesia Postprocedure Evaluation (Signed)
 Anesthesia Post Note  Patient: Ruth Harris  Procedure(s) Performed: POSTERIOR LUMBAR FUSION 2 LEVEL (Back)     Patient location during evaluation: PACU Anesthesia Type: General Level of consciousness: awake and alert Pain management: pain level controlled Vital Signs Assessment: post-procedure vital signs reviewed and stable Respiratory status: spontaneous breathing, nonlabored ventilation and respiratory function stable Cardiovascular status: blood pressure returned to baseline and stable Postop Assessment: no apparent nausea or vomiting Anesthetic complications: no   No notable events documented.  Last Vitals:  Vitals:   05/12/24 1226 05/12/24 1243  BP: (!) 123/59 105/71  Pulse: 90 85  Resp: 11 16  Temp: 36.4 C   SpO2: 94% 93%    Last Pain:  Vitals:   05/12/24 1250  PainSc: 4                  Debby FORBES Like

## 2024-05-12 NOTE — Anesthesia Procedure Notes (Signed)
 Procedure Name: Intubation Date/Time: 05/12/2024 7:53 AM  Performed by: Genny Gun, CRNAPre-anesthesia Checklist: Patient identified, Emergency Drugs available, Suction available, Patient being monitored and Timeout performed Patient Re-evaluated:Patient Re-evaluated prior to induction Oxygen Delivery Method: Circle system utilized Preoxygenation: Pre-oxygenation with 100% oxygen Induction Type: IV induction Ventilation: Mask ventilation without difficulty and Oral airway inserted - appropriate to patient size Laryngoscope Size: Mac and 4 Grade View: Grade I Tube type: Oral Tube size: 7.0 mm Number of attempts: 1 Placement Confirmation: ETT inserted through vocal cords under direct vision, positive ETCO2 and breath sounds checked- equal and bilateral Secured at: 21 cm Tube secured with: Tape Dental Injury: Teeth and Oropharynx as per pre-operative assessment

## 2024-05-12 NOTE — Transfer of Care (Signed)
 Immediate Anesthesia Transfer of Care Note  Patient: Ruth Harris  Procedure(s) Performed: POSTERIOR LUMBAR FUSION 2 LEVEL (Back)  Patient Location: PACU  Anesthesia Type:General  Level of Consciousness: awake, oriented, and patient cooperative  Airway & Oxygen Therapy: Patient Spontanous Breathing and Patient connected to face mask oxygen  Post-op Assessment: Report given to RN and Post -op Vital signs reviewed and stable  Post vital signs: Reviewed and stable  Last Vitals:  Vitals Value Taken Time  BP 128/49 05/12/24 11:52  Temp    Pulse 92 05/12/24 11:55  Resp 22 05/12/24 11:55  SpO2 89 % 05/12/24 11:55  Vitals shown include unfiled device data.  Last Pain:  Vitals:   05/12/24 0621  PainSc: 0-No pain         Complications: No notable events documented.

## 2024-05-12 NOTE — Op Note (Signed)
 Preoperative diagnosis: Grade 1 spondylolisthesis L3-4 L4-5 severe lumbar spinal stenosis L3-4 L4-5 neurogenic claudication lumbar radiculopathy.  Postoperative diagnosis: Same.  Procedure: #1 redo decompressive lumbar laminectomies L3-4 L4-5 with complete medial facetectomies Rodick foraminotomies of the L3, L4, L5 nerve roots in excess and requiring more work than would be needed with a standard interbody fusion.  2.  Posterior lumbar fusion L3-4 L4-5 utilizing the Alphatec insert rotate titanium cages packed with locally harvested autograft mixed with Vivigen.  3.  Cortical screw fixation L3-4 L4-5 utilizing the Alphatec modular cortical screw set.  Surgeon: Arley helling.  Assistant: Rockey Peru.  Assistant to: Suzen Click.  Anesthesia: General  EBL: Minimal.  HPI: 78 year old female progressive worsening back and bilateral hip and leg pain worse on the left workup revealed severe foraminal collapse degenerative disc disease spinal stenosis at L3-4 and L4-5.  Due to the patient's progression of clinical syndrome imaging findings and failed conservative treatment I recommended decompressive laminectomies and interbody fusions at those levels.  I extensively reviewed the risks and benefits of the operation with her as well as perioperative course expectations of outcome and alternatives to surgery and she understood and agreed to proceed forward.  Operative procedure: Patient was brought into the OR was induced to general esthesia positioned prone the Wilson frame her back was prepped and draped in routine sterile fashion.  Her old incision was identified and extended cephalad caudally scar tissue was dissected free and subperiosteal dissection was carried in the residual of the lamina of L3 facet joints at 3 4 and 4 5.  Separate entrance was placed spinous process at L3 was removed central decompression was begun above the 3 4 disc base to identify normal anatomy.  Then extensively worked  through the scar tissue freeing up the scar tissue marching down both sides complete facetectomies were performed there was marked hourglass compression of thecal sac at both levels this was all removed complete medial facets were removed and the scar tissue was freed up freeing up the foramina of L3, L4, and L5.  Aggressive under biting of both the superior tickling facets gained access to the lateral margin of the disc base.  Then tension taken the interbody work to space was incised at 3 4 bilaterally utilizing sequential distraction I sized up at 9 mm 22 length 10 degree cage and cleaned out the disc base prepared the endplates there was a extraforaminal disc herniation on the right at 3 4 this was all removed then the cages went in well under fluoroscopy extensive mount of autograft mix packed centrally the same thing happened at L4-5 cages went well extensive mount of autograft centrally after adequate discectomy and endplate preparation.  Then cortical screws were placed there was a lateral breach on the left at L3 this screw was redirected all the other screws had excellent purchase I then cored contoured the rods anchored everything in place reinspected the foramina to confirm patency and no migration of graft material Gelfoam was ON top of the dura Exparel  was injected in the fascia Hemovac drain was placed and the wound was closed in layers with interrupted Vicryl and the skin was closed running 4 subcuticular.  Dermabond benzoin Steri-Strips were all applied patient to cover him in stable condition.  At the end the case on needle count sponge counts were correct.

## 2024-05-13 LAB — GLUCOSE, CAPILLARY
Glucose-Capillary: 143 mg/dL — ABNORMAL HIGH (ref 70–99)
Glucose-Capillary: 146 mg/dL — ABNORMAL HIGH (ref 70–99)
Glucose-Capillary: 133 mg/dL — ABNORMAL HIGH (ref 70–99)
Glucose-Capillary: 110 mg/dL — ABNORMAL HIGH (ref 70–99)

## 2024-05-13 MED ORDER — INSULIN ASPART 100 UNIT/ML IJ SOLN
0.0000 [IU] | Freq: Three times a day (TID) | INTRAMUSCULAR | Status: DC
  Administered 2024-05-13: 3 [IU] via SUBCUTANEOUS
  Administered 2024-05-13: 2 [IU] via SUBCUTANEOUS

## 2024-05-13 MED ORDER — HYDROCODONE-ACETAMINOPHEN 5-325 MG PO TABS
1.0000 | ORAL_TABLET | ORAL | Status: DC | PRN
  Administered 2024-05-13: 1 via ORAL
  Filled 2024-05-13 (×2): qty 1

## 2024-05-13 MED ORDER — BISACODYL 5 MG PO TBEC
5.0000 mg | DELAYED_RELEASE_TABLET | Freq: Every day | ORAL | Status: DC | PRN
  Administered 2024-05-13: 5 mg via ORAL
  Filled 2024-05-13: qty 1

## 2024-05-13 MED FILL — Heparin Sodium (Porcine) Inj 1000 Unit/ML: INTRAMUSCULAR | Qty: 30 | Status: AC

## 2024-05-13 MED FILL — Sodium Chloride IV Soln 0.9%: INTRAVENOUS | Qty: 2000 | Status: AC

## 2024-05-13 NOTE — Evaluation (Signed)
 Physical Therapy Evaluation Patient Details Name: Ruth Harris MRN: [id number] DOB: 1946-04-14 Today's Date: 05/13/2024  History of Present Illness  Pt is 78 yo female who presents on 05/12/24 for L3-4, L4-5 PLIF. PMH: lumbar laminectomy 2023 followed by fall in 12/23 with ankle fx, DM2, GERD, DCIS, HTN, L shoulder RTC repair  Clinical Impression  Pt admitted with above diagnosis. Pt from home alone in 1 level house with 4 STE. Pt was independent and driving prior to surgery but activity level has been steadily declining since last back surgery and subsequent fall with ankle fx. Pt now with LE weakness and slow mobility to the point that she has increased fall risk and mobility pace not sufficient to return home alone until she receives further rehab. Patient will benefit from continued inpatient follow up therapy, <3 hours/day.  Pt currently with functional limitations due to the deficits listed below (see PT Problem List). Pt will benefit from acute skilled PT to increase their independence and safety with mobility to allow discharge.           If plan is discharge home, recommend the following: A little help with walking and/or transfers;A little help with bathing/dressing/bathroom;Assistance with cooking/housework;Assist for transportation;Help with stairs or ramp for entrance   Can travel by private vehicle   Yes    Equipment Recommendations Rolling walker (2 wheels)  Recommendations for Other Services  OT consult    Functional Status Assessment Patient has had a recent decline in their functional status and demonstrates the ability to make significant improvements in function in a reasonable and predictable amount of time.     Precautions / Restrictions Precautions Precautions: Fall;Back Precaution Booklet Issued: Yes (comment) Recall of Precautions/Restrictions: Intact Precaution/Restrictions Comments: reviewed precautions and proper posture and positioning Required Braces or  Orthoses: Spinal Brace Spinal Brace: Lumbar corset (not needed per orders but MD told pt to wear for comfort PRN) Restrictions Weight Bearing Restrictions Per Provider Order: No      Mobility  Bed Mobility Overal bed mobility: Needs Assistance Bed Mobility: Rolling, Sidelying to Sit, Sit to Supine Rolling: Min assist Sidelying to sit: Mod assist   Sit to supine: Min assist   General bed mobility comments: vc's for log rolling, min A to get fully on side. Mod A to trunk to come to upright sitting. Increased time and cueing needed to scoot to EOB. Min A to LE's for return to supine    Transfers Overall transfer level: Needs assistance Equipment used: Rolling walker (2 wheels) Transfers: Sit to/from Stand Sit to Stand: Min assist           General transfer comment: vc's for hand placement, very slow with transfers, min A to steady    Ambulation/Gait Ambulation/Gait assistance: Min assist Gait Distance (Feet): 70 Feet Assistive device: Rolling walker (2 wheels) Gait Pattern/deviations: Step-through pattern, Trunk flexed, Decreased stride length Gait velocity: decreased Gait velocity interpretation: <1.31 ft/sec, indicative of household ambulator   General Gait Details: vc's for posture and abdominal activation. Pt fatigues quickly  Stairs            Wheelchair Mobility     Tilt Bed    Modified Rankin (Stroke Patients Only)       Balance Overall balance assessment: Needs assistance, History of Falls Sitting-balance support: No upper extremity supported, Feet supported Sitting balance-Leahy Scale: Fair     Standing balance support: Bilateral upper extremity supported, Reliant on assistive device for balance, During functional activity Standing balance-Leahy Scale: Poor  Standing balance comment: pt moves slowly to the point that she does not have quick enough reaction time to prevent falling at this point                              Pertinent Vitals/Pain Pain Assessment Pain Assessment: Faces Faces Pain Scale: Hurts even more Pain Location: back Pain Descriptors / Indicators: Operative site guarding, Sore Pain Intervention(s): Limited activity within patient's tolerance, Monitored during session    Home Living Family/patient expects to be discharged to:: Private residence Living Arrangements: Alone Available Help at Discharge: Family;Available PRN/intermittently Type of Home: House Home Access: Stairs to enter Entrance Stairs-Rails: Right Entrance Stairs-Number of Steps: 4   Home Layout: One level Home Equipment: Cane - single point Additional Comments: son reports doorways of home do not fit RW easily. Son and his wife both work    Prior Function Prior Level of Function : Independent/Modified Independent;Driving             Mobility Comments: has had decreased mobility past 2 years, minimal tolerance for ambulation, was using cane ADLs Comments: independent but very slow     Extremity/Trunk Assessment   Upper Extremity Assessment Upper Extremity Assessment: Defer to OT evaluation    Lower Extremity Assessment Lower Extremity Assessment: Generalized weakness    Cervical / Trunk Assessment Cervical / Trunk Assessment: Kyphotic  Communication   Communication Communication: No apparent difficulties    Cognition Arousal: Alert Behavior During Therapy: WFL for tasks assessed/performed   PT - Cognitive impairments: No apparent impairments                       PT - Cognition Comments: sometimes slow to process but in tact Following commands: Intact       Cueing Cueing Techniques: Verbal cues     General Comments General comments (skin integrity, edema, etc.): VSS. Son present for education    Exercises Other Exercises Other Exercises: lie flat with 1 piilow x3 mins Other Exercises: supine pelvic tilts x5   Assessment/Plan    PT Assessment Patient needs continued  PT services  PT Problem List Decreased strength;Decreased activity tolerance;Decreased balance;Decreased mobility;Decreased knowledge of precautions;Pain       PT Treatment Interventions DME instruction;Gait training;Functional mobility training;Therapeutic activities;Therapeutic exercise;Balance training;Stair training;Patient/family education    PT Goals (Current goals can be found in the Care Plan section)  Acute Rehab PT Goals Patient Stated Goal: get stronger and independent PT Goal Formulation: With patient Time For Goal Achievement: 05/20/24 Potential to Achieve Goals: Good    Frequency Min 2X/week     Co-evaluation               AM-PAC PT 6 Clicks Mobility  Outcome Measure Help needed turning from your back to your side while in a flat bed without using bedrails?: A Little Help needed moving from lying on your back to sitting on the side of a flat bed without using bedrails?: A Lot Help needed moving to and from a bed to a chair (including a wheelchair)?: A Little Help needed standing up from a chair using your arms (e.g., wheelchair or bedside chair)?: A Little Help needed to walk in hospital room?: A Little Help needed climbing 3-5 steps with a railing? : Total 6 Click Score: 15    End of Session Equipment Utilized During Treatment: Gait belt;Back brace Activity Tolerance: Patient tolerated treatment well Patient left: in bed;with  call bell/phone within reach;with family/visitor present Nurse Communication: Mobility status PT Visit Diagnosis: Unsteadiness on feet (R26.81);Repeated falls (R29.6);Difficulty in walking, not elsewhere classified (R26.2);Pain Pain - part of body:  (back)    Time: 9189-9152 PT Time Calculation (min) (ACUTE ONLY): 37 min   Charges:   PT Evaluation $PT Eval Moderate Complexity: 1 Mod PT Treatments $Gait Training: 8-22 mins PT General Charges $$ ACUTE PT VISIT: 1 Visit         Richerd Lipoma, PT  Acute Rehab  Services Secure chat preferred Office [phone]   Richerd CROME Tamanna Whitson 05/13/2024, 9:18 AM

## 2024-05-13 NOTE — NC FL2 (Signed)
 [place]  MEDICAID FL2 LEVEL OF CARE FORM     IDENTIFICATION  Patient Name: Ruth Harris Birthdate: 1946-06-08 Sex: female Admission Date (Current Location): 05/12/2024  [facility] and [place] Number:  Chiropodist and Address:  [facility] [place], [address] [place], [place] [address]      Provider Number: [id number]  Attending Physician Name and Address:  Onetha Kuba, MD  Relative Name and Phone Number:       Current Level of Care: Hospital Recommended Level of Care: Skilled Nursing Facility Prior Approval Number:    Date Approved/Denied:   PASRR Number: [id number]  Discharge Plan: SNF    Current Diagnoses: Patient Active Problem List   Diagnosis Date Noted   Spinal stenosis of lumbar region 05/12/2024   Spinal stenosis at L4-L5 level 07/07/2022   HLD (hyperlipidemia) 03/28/2014   Acid reflux 03/28/2014   Dermatitis, eczematoid 07/24/2012   Airway hyperreactivity 12/27/2011    Orientation RESPIRATION BLADDER Height & Weight     Self, Time, Situation, Place  Normal Continent Weight: 189 lb (85.7 kg) Height:  5' 1 (154.9 cm)  BEHAVIORAL SYMPTOMS/MOOD NEUROLOGICAL BOWEL NUTRITION STATUS      Continent Diet (carb modified)  AMBULATORY STATUS COMMUNICATION OF NEEDS Skin   Limited Assist Verbally Surgical wounds (closed back incision, adhesive strips)                       Personal Care Assistance Level of Assistance  Bathing, Feeding, Dressing Bathing Assistance: Limited assistance Feeding assistance: Independent Dressing Assistance: Limited assistance     Functional Limitations Info             SPECIAL CARE FACTORS FREQUENCY  PT (By licensed PT), OT (By licensed OT)     PT Frequency: 5x/wk OT Frequency: 5x/wk            Contractures Contractures Info: Not present    Additional Factors Info  Code Status, Allergies, Insulin  Sliding Scale Code Status Info: Full Allergies Info: Sulfa Antibiotics, Shellac,  Trazodone   Insulin  Sliding Scale Info: see DC summary       Current Medications (05/13/2024):  This is the current hospital active medication list Current Facility-Administered Medications  Medication Dose Route Frequency Provider Last Rate Last Admin   0.9 %  sodium chloride  infusion  250 mL Intravenous Continuous Onetha Kuba, MD       acetaminophen  (TYLENOL ) tablet 650 mg  650 mg Oral Q4H PRN Onetha Kuba, MD       Or   acetaminophen  (TYLENOL ) suppository 650 mg  650 mg Rectal Q4H PRN Onetha Kuba, MD       alum & mag hydroxide-simeth (MAALOX/MYLANTA) 200-200-20 MG/5ML suspension 30 mL  30 mL Oral Q6H PRN Onetha Kuba, MD       atorvastatin  (LIPITOR) tablet 20 mg  20 mg Oral q AM Onetha Kuba, MD   20 mg at 05/13/24 9266   ceFAZolin  (ANCEF ) IVPB 2g/100 mL premix  2 g Intravenous Q8H Cram, Gary, MD 200 mL/hr at 05/13/24 0521 2 g at 05/13/24 0521   cyclobenzaprine  (FLEXERIL ) tablet 10 mg  10 mg Oral TID PRN Onetha Kuba, MD   10 mg at 05/13/24 0537   diphenhydrAMINE  (BENADRYL ) capsule 25 mg  25 mg Oral QHS Onetha Kuba, MD   25 mg at 05/12/24 2207   docusate sodium  (COLACE) capsule 100 mg  100 mg Oral BID PRN Onetha Kuba, MD   100 mg at 05/12/24 2207  empagliflozin  (JARDIANCE ) tablet 25 mg  25 mg Oral q AM Onetha Kuba, MD   25 mg at 05/13/24 0732   fluconazole  (DIFLUCAN ) tablet 200 mg  200 mg Oral Daily Amend, Caron G, RPH   200 mg at 05/13/24 1014   fluticasone  (FLONASE ) 50 MCG/ACT nasal spray 2 spray  2 spray Each Nare Daily PRN Onetha Kuba, MD       HYDROcodone -acetaminophen  (NORCO/VICODIN) 5-325 MG per tablet 2 tablet  2 tablet Oral Q4H PRN Onetha Kuba, MD   2 tablet at 05/13/24 1013   HYDROmorphone  (DILAUDID ) injection 0.5 mg  0.5 mg Intravenous Q2H PRN Onetha Kuba, MD   0.5 mg at 05/12/24 1335   insulin  aspart (novoLOG ) injection 0-15 Units  0-15 Units Subcutaneous TID WC Onetha Kuba, MD       losartan  (COZAAR ) tablet 50 mg  50 mg Oral q AM Onetha Kuba, MD   50 mg at 05/13/24 0732    menthol -cetylpyridinium (CEPACOL) lozenge 3 mg  1 lozenge Oral PRN Onetha Kuba, MD       Or   phenol (CHLORASEPTIC) mouth spray 1 spray  1 spray Mouth/Throat PRN Onetha Kuba, MD       metFORMIN  (GLUCOPHAGE -XR) 24 hr tablet 1,500 mg  1,500 mg Oral Q supper Onetha Kuba, MD   1,500 mg at 05/12/24 1601   ondansetron  (ZOFRAN ) tablet 4 mg  4 mg Oral Q6H PRN Onetha Kuba, MD       Or   ondansetron  (ZOFRAN ) injection 4 mg  4 mg Intravenous Q6H PRN Onetha Kuba, MD       pantoprazole  (PROTONIX ) EC tablet 40 mg  40 mg Oral QHS Onetha Kuba, MD   40 mg at 05/12/24 2207   senna (SENOKOT) tablet 8.6-17.2 mg  1-2 tablet Oral BID PRN Onetha Kuba, MD   8.6 mg at 05/12/24 2208   sodium chloride  flush (NS) 0.9 % injection 3 mL  3 mL Intravenous Q12H Onetha Kuba, MD   3 mL at 05/13/24 1016   sodium chloride  flush (NS) 0.9 % injection 3 mL  3 mL Intravenous PRN Onetha Kuba, MD         Discharge Medications: Please see discharge summary for a list of discharge medications.  Relevant Imaging Results:  Relevant Lab Results:   Additional Information SS#: [id number]  Almarie CHRISTELLA Goodie, LCSW

## 2024-05-13 NOTE — TOC Initial Note (Signed)
 Transition of Care [facility]) - Initial/Assessment Note    Patient Details  Name: Ruth Harris MRN: [id number] Date of Birth: 05-27-1946  [facility] CM/SW Contact:    Almarie CHRISTELLA Goodie, LCSW Phone Number: 05/13/2024, 11:32 AM  Clinical Narrative:     Patient agreeable to SNF, preference for [facility]. CSW spoke with Admissions at [facility], they have a bed available for patient. CSW completed referral and sent, then contacted [employer] to request insurance authorization. Awaiting approval. CSW to follow.              Expected Discharge Plan: Skilled Nursing Facility Barriers to Discharge: Continued Medical Work up, English as a second language teacher   Patient Goals and CMS Choice Patient states their goals for this hospitalization and ongoing recovery are:: get rehab at [email or web address].gov Compare Post Acute Care list provided to:: Patient Choice offered to / list presented to : Patient [place] ownership interest in [facility].provided to:: Patient    Expected Discharge Plan and Services     Post Acute Care Choice: Skilled Nursing Facility Living arrangements for the past 2 months: Single Family Home                                      Prior Living Arrangements/Services Living arrangements for the past 2 months: Single Family Home Lives with:: Self Patient language and need for interpreter reviewed:: No Do you feel safe going back to the place where you live?: Yes      Need for Family Participation in Patient Care: No (Comment) Care giver support system in place?: No (comment)   Criminal Activity/Legal Involvement Pertinent to Current Situation/Hospitalization: No - Comment as needed  Activities of Daily Living      Permission Sought/Granted Permission sought to share information with : Facility Industrial/product designer granted to share information with : Yes, Verbal Permission Granted     Permission granted to  share info w AGENCY: [place]        Emotional Assessment Appearance:: Appears stated age Attitude/Demeanor/Rapport: Engaged Affect (typically observed): Appropriate Orientation: : Oriented to Self, Oriented to Place, Oriented to  Time, Oriented to Situation Alcohol / Substance Use: Not Applicable Psych Involvement: No (comment)  Admission diagnosis:  Lumbar radiculopathy [M54.16] Spinal stenosis of lumbar region [M48.061] Patient Active Problem List   Diagnosis Date Noted   Spinal stenosis of lumbar region 05/12/2024   Spinal stenosis at L4-L5 level 07/07/2022   HLD (hyperlipidemia) 03/28/2014   Acid reflux 03/28/2014   Dermatitis, eczematoid 07/24/2012   Airway hyperreactivity 12/27/2011   PCP:  Lenon Layman ORN, MD Pharmacy:   [phone] - [place], [place] - [address] [place] [address] Phone: [phone] Fax: [phone]  [facility] [address] ([address]), [place] - [address] [place] ([place]) [place] [address] Phone: [phone] Fax: [phone]     Social Drivers of Health (SDOH) Social History: SDOH Screenings   Food Insecurity: No Food Insecurity (07/18/2023)   Received from [facility]  Housing: Unknown (01/09/2024)   [facility]  Transportation Needs: No Transportation Needs (07/18/2023)   Received from [facility]  Utilities: Not At Risk (07/18/2023)   Received from [facility]  Financial Resource Strain: Low Risk  (07/18/2023)   [facility] from [facility]  Physical Activity: Unknown (09/09/2018)   Received from [facility]  Tobacco Use: Low Risk  (05/12/2024)   SDOH Interventions:     Readmission Risk Interventions     No data to display

## 2024-05-13 NOTE — Evaluation (Signed)
 Occupational Therapy Evaluation Patient Details Name: Ruth Harris MRN: [id number] DOB: 27-Feb-1946 Today's Date: 05/13/2024   History of Present Illness   Pt is 78 yo female who presents on 05/12/24 for L3-4, L4-5 PLIF. PMH: lumbar laminectomy 2023 followed by fall in 12/23 with ankle fx, DM2, GERD, DCIS, HTN, L shoulder RTC repair     Clinical Impressions Pt lives alone, was ind at baseline with ADLs but needed incr time. Ambulates with cane. Pt currently needing set up - max A for ADLs, min-mod A for bed mobility and min A for transfers with RW. Pt educated on back precautions and compensatory strategies for ADLs/functional mobility and pt able to demo during session but will need reinforcement. Pt presenting with impairments listed below, will follow acutely. Patient will benefit from continued inpatient follow up therapy, <3 hours/day to maximize safety/ind with ADL/functional mobility.      If plan is discharge home, recommend the following:   A little help with walking and/or transfers;A lot of help with bathing/dressing/bathroom;Assistance with cooking/housework;Assist for transportation;Help with stairs or ramp for entrance     Functional Status Assessment   Patient has had a recent decline in their functional status and demonstrates the ability to make significant improvements in function in a reasonable and predictable amount of time.     Equipment Recommendations   None recommended by OT     Recommendations for Other Services   PT consult     Precautions/Restrictions   Precautions Precautions: Fall;Back Precaution Booklet Issued: Yes (comment) Recall of Precautions/Restrictions: Intact Precaution/Restrictions Comments: reviewed precautions and proper posture and positioning Required Braces or Orthoses: Spinal Brace Spinal Brace: Lumbar corset (not needed per orders but MD told pt to wear for comfort PRN) Restrictions Weight Bearing Restrictions Per  Provider Order: No     Mobility Bed Mobility Overal bed mobility: Needs Assistance Bed Mobility: Rolling, Sidelying to Sit, Sit to Sidelying Rolling: Min assist Sidelying to sit: Mod assist     Sit to sidelying: Min assist General bed mobility comments: via log roll technique    Transfers Overall transfer level: Needs assistance Equipment used: Rolling walker (2 wheels) Transfers: Sit to/from Stand Sit to Stand: Min assist                  Balance Overall balance assessment: Needs assistance, History of Falls Sitting-balance support: No upper extremity supported, Feet supported Sitting balance-Leahy Scale: Fair     Standing balance support: Bilateral upper extremity supported, Reliant on assistive device for balance, During functional activity Standing balance-Leahy Scale: Poor Standing balance comment: pt moves slowly to the point that she does not have quick enough reaction time to prevent falling at this point                           ADL either performed or assessed with clinical judgement   ADL Overall ADL's : Needs assistance/impaired Eating/Feeding: Set up;Sitting   Grooming: Set up;Sitting   Upper Body Bathing: Sitting;Moderate assistance   Lower Body Bathing: Moderate assistance;Sitting/lateral leans   Upper Body Dressing : Moderate assistance;Sitting   Lower Body Dressing: Maximal assistance;Sitting/lateral leans   Toilet Transfer: Minimal assistance;Ambulation;Rolling walker (2 wheels);Regular Toilet   Toileting- Clothing Manipulation and Hygiene: Contact guard assist       Functional mobility during ADLs: Minimal assistance;Rolling walker (2 wheels)       Vision   Vision Assessment?: No apparent visual deficits     Perception Perception:  Not tested       Praxis Praxis: Not tested       Pertinent Vitals/Pain Pain Assessment Pain Assessment: Faces Pain Score: 6  Faces Pain Scale: Hurts even more Pain Location:  back Pain Descriptors / Indicators: Operative site guarding, Sore Pain Intervention(s): Limited activity within patient's tolerance, Monitored during session, Repositioned     Extremity/Trunk Assessment Upper Extremity Assessment Upper Extremity Assessment: Generalized weakness   Lower Extremity Assessment Lower Extremity Assessment: Defer to PT evaluation   Cervical / Trunk Assessment Cervical / Trunk Assessment: Back Surgery   Communication Communication Communication: No apparent difficulties   Cognition Arousal: Alert Behavior During Therapy: WFL for tasks assessed/performed Cognition: No apparent impairments                               Following commands: Intact       Cueing  General Comments   Cueing Techniques: Verbal cues  VSS   Exercises     Shoulder Instructions      Home Living Family/patient expects to be discharged to:: Private residence Living Arrangements: Alone Available Help at Discharge: Family;Available PRN/intermittently Type of Home: House Home Access: Stairs to enter [employer] of Steps: 4 Entrance Stairs-Rails: Right Home Layout: One level     Bathroom Shower/Tub: Producer, television/film/video: Standard Bathroom Accessibility: No   Home Equipment: [place] - single point   Additional Comments: son reports doorways of home do not fit RW easily. Son and his wife both work      Prior Functioning/Environment Prior Level of Function : Independent/Modified Independent;Driving             Mobility Comments: has had decreased mobility past 2 years, minimal tolerance for ambulation, was using cane ADLs Comments: independent but very slow    OT Problem List: Decreased strength;Decreased range of motion;Decreased activity tolerance;Impaired balance (sitting and/or standing);Decreased safety awareness;Decreased knowledge of precautions   OT Treatment/Interventions: Self-care/ADL training;Therapeutic  exercise;Energy conservation;DME and/or AE instruction;Therapeutic activities;Patient/family education;Balance training      OT Goals(Current goals can be found in the care plan section)   Acute Rehab OT Goals Patient Stated Goal: none stated OT Goal Formulation: With patient Time For Goal Achievement: 05/27/24 Potential to Achieve Goals: Good ADL Goals Pt Will Perform Upper Body Dressing: with modified independence;sitting Pt Will Perform Lower Body Dressing: with modified independence;sitting/lateral leans;sit to/from stand Pt Will Transfer to Toilet: with modified independence;ambulating;regular height toilet Pt Will Perform Tub/Shower Transfer: Shower transfer;with modified independence;ambulating   OT Frequency:  Min 2X/week    Co-evaluation              AM-PAC OT 6 Clicks Daily Activity     Outcome Measure Help from another person eating meals?: A Little Help from another person taking care of personal grooming?: A Little Help from another person toileting, which includes using toliet, bedpan, or urinal?: A Lot Help from another person bathing (including washing, rinsing, drying)?: A Little Help from another person to put on and taking off regular upper body clothing?: A Little Help from another person to put on and taking off regular lower body clothing?: A Lot 6 Click Score: 16   End of Session Equipment Utilized During Treatment: Rolling walker (2 wheels);Gait belt Nurse Communication: Mobility status  Activity Tolerance:   Patient left: in bed;with call bell/phone within reach  OT Visit Diagnosis: Unsteadiness on feet (R26.81);Other abnormalities of gait and mobility (R26.89);Muscle weakness (  generalized) (M62.81)                Time: 9147-9083 OT Time Calculation (min): 24 min Charges:  OT General Charges $OT Visit: 1 Visit OT Evaluation $OT Eval Low Complexity: 1 Low OT Treatments $Self Care/Home Management : 8-22 mins  Jahnavi Muratore K, OTD,  OTR/L SecureChat Preferred Acute Rehab [phone]   Laneta POUR Koonce 05/13/2024, 10:39 AM

## 2024-05-13 NOTE — Progress Notes (Signed)
 Subjective: Patient reports doing ok, a lot of back pain   Objective: Vital signs in last 24 hours: Temp:  [97.6 F (36.4 C)-98.5 F (36.9 C)] 98.4 F (36.9 C) (08/05 0717) Pulse Rate:  [81-91] 88 (08/05 0717) Resp:  [11-20] 16 (08/05 0717) BP: (105-140)/(45-71) 122/45 (08/05 0717) SpO2:  [93 %-100 %] 97 % (08/05 0717)  Intake/Output from previous day: 08/04 0701 - 08/05 0700 In: 3790 [P.O.:1440; I.V.:2000; IV Piggyback:350] Out: 605 [Urine:100; Drains:305; Blood:200] Intake/Output this shift: No intake/output data recorded.  Neurologic: Grossly normal  Lab Results: Lab Results  Component Value Date   WBC 9.4 05/07/2024   HGB 13.6 05/07/2024   HCT 43.5 05/07/2024   MCV 93.5 05/07/2024   PLT 282 05/07/2024   Lab Results  Component Value Date   INR 1.0 12/10/2018   BMET Lab Results  Component Value Date   NA 138 05/07/2024   K 4.3 05/07/2024   CL 105 05/07/2024   CO2 19 (L) 05/07/2024   GLUCOSE 120 (H) 05/07/2024   BUN 16 05/07/2024   CREATININE 0.91 05/07/2024   CALCIUM  9.6 05/07/2024    Studies/Results: DG Lumbar Spine 2-3 Views Result Date: 05/12/2024 CLINICAL DATA:  461500 Elective surgery 461500 EXAM: LUMBAR SPINE - 2-3 VIEW COMPARISON:  None Available. FINDINGS: Several spot radiographs during the various phases of lower lumbar spinal fixation were submitted for review. There is L3 through L5 ongoing fusion with transpedicular screws and intervertebral disc spacers. Please refer to the separately issued operative report for further details. Fluoroscopy time: 72.3 seconds Radiation dose: 72.21 mGy IMPRESSION: Intraoperative fluoroscopy provided for lower lumbar spinal fixation. Electronically Signed   By: Ree Molt M.D.   On: 05/12/2024 11:15   DG C-Arm 1-60 Min-No Report Result Date: 05/12/2024 Fluoroscopy was utilized by the requesting physician.  No radiographic interpretation.   DG C-Arm 1-60 Min-No Report Result Date: 05/12/2024 Fluoroscopy was  utilized by the requesting physician.  No radiographic interpretation.   DG C-Arm 1-60 Min-No Report Result Date: 05/12/2024 Fluoroscopy was utilized by the requesting physician.  No radiographic interpretation.    Assessment/Plan: Postop day 1 two level PLIF. Progressing slowly. She does not have any help at home so she would like to go to [facility] facility. We will have case management work on this for us . Therapy today    LOS: 1 day    Suzen Lacks [facility] 05/13/2024, 7:44 AM

## 2024-05-14 ENCOUNTER — Other Ambulatory Visit: Payer: Self-pay | Admitting: Student

## 2024-05-14 DIAGNOSIS — Z981 Arthrodesis status: Secondary | ICD-10-CM | POA: Diagnosis not present

## 2024-05-14 DIAGNOSIS — G4700 Insomnia, unspecified: Secondary | ICD-10-CM | POA: Diagnosis not present

## 2024-05-14 DIAGNOSIS — Z741 Need for assistance with personal care: Secondary | ICD-10-CM | POA: Diagnosis not present

## 2024-05-14 DIAGNOSIS — N182 Chronic kidney disease, stage 2 (mild): Secondary | ICD-10-CM | POA: Diagnosis not present

## 2024-05-14 DIAGNOSIS — Z4789 Encounter for other orthopedic aftercare: Secondary | ICD-10-CM | POA: Diagnosis not present

## 2024-05-14 DIAGNOSIS — R600 Localized edema: Secondary | ICD-10-CM | POA: Diagnosis not present

## 2024-05-14 DIAGNOSIS — E785 Hyperlipidemia, unspecified: Secondary | ICD-10-CM | POA: Diagnosis not present

## 2024-05-14 DIAGNOSIS — M6281 Muscle weakness (generalized): Secondary | ICD-10-CM | POA: Diagnosis not present

## 2024-05-14 DIAGNOSIS — R262 Difficulty in walking, not elsewhere classified: Secondary | ICD-10-CM | POA: Diagnosis not present

## 2024-05-14 DIAGNOSIS — Z6835 Body mass index (BMI) 35.0-35.9, adult: Secondary | ICD-10-CM | POA: Diagnosis not present

## 2024-05-14 DIAGNOSIS — M48061 Spinal stenosis, lumbar region without neurogenic claudication: Secondary | ICD-10-CM | POA: Diagnosis not present

## 2024-05-14 DIAGNOSIS — I10 Essential (primary) hypertension: Secondary | ICD-10-CM | POA: Diagnosis not present

## 2024-05-14 DIAGNOSIS — J4520 Mild intermittent asthma, uncomplicated: Secondary | ICD-10-CM | POA: Diagnosis not present

## 2024-05-14 DIAGNOSIS — K5900 Constipation, unspecified: Secondary | ICD-10-CM | POA: Diagnosis not present

## 2024-05-14 DIAGNOSIS — D649 Anemia, unspecified: Secondary | ICD-10-CM | POA: Diagnosis not present

## 2024-05-14 DIAGNOSIS — R2689 Other abnormalities of gait and mobility: Secondary | ICD-10-CM | POA: Diagnosis not present

## 2024-05-14 DIAGNOSIS — J45909 Unspecified asthma, uncomplicated: Secondary | ICD-10-CM | POA: Diagnosis not present

## 2024-05-14 DIAGNOSIS — M8580 Other specified disorders of bone density and structure, unspecified site: Secondary | ICD-10-CM | POA: Diagnosis not present

## 2024-05-14 DIAGNOSIS — J302 Other seasonal allergic rhinitis: Secondary | ICD-10-CM | POA: Diagnosis not present

## 2024-05-14 DIAGNOSIS — E1122 Type 2 diabetes mellitus with diabetic chronic kidney disease: Secondary | ICD-10-CM | POA: Diagnosis not present

## 2024-05-14 DIAGNOSIS — E119 Type 2 diabetes mellitus without complications: Secondary | ICD-10-CM | POA: Diagnosis not present

## 2024-05-14 LAB — GLUCOSE, CAPILLARY: Glucose-Capillary: 96 mg/dL (ref 70–99)

## 2024-05-14 MED ORDER — HYDROCODONE-ACETAMINOPHEN 5-325 MG PO TABS: 1.0000 | ORAL_TABLET | ORAL | 0 refills | Status: DC | PRN

## 2024-05-14 MED ORDER — CYCLOBENZAPRINE HCL 10 MG PO TABS: 10.0000 mg | ORAL_TABLET | Freq: Three times a day (TID) | ORAL | 0 refills | Status: AC | PRN

## 2024-05-14 NOTE — Discharge Summary (Addendum)
 Physician Discharge Summary  Patient ID: Ruth Harris MRN: [id number] DOB/AGE: [birth year] 78 y.o.  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     Discharge Diagnoses: same   Discharged Condition: good  Hospital Course: The patient was admitted on 05/12/2024 and taken to the operating room where the patient underwent PLIf L3-4, L4-5. The patient tolerated the procedure well and was taken to the recovery room and then to the floor in stable condition. The hospital course was routine. There were no complications. The wound remained clean dry and intact. Pt had appropriate back soreness. No complaints of leg pain or new N/T/W. The patient remained afebrile with stable vital signs, and tolerated a regular diet. The patient continued to increase activities, and pain was well controlled with oral pain medications.   Consults: None  Significant Diagnostic Studies:  Results for orders placed or performed during the hospital encounter of 05/12/24  Glucose, capillary   Collection Time: 05/12/24  6:13 AM  Result Value Ref Range   Glucose-Capillary 114 (H) 70 - 99 mg/dL  ABO/Rh   Collection Time: 05/12/24  7:20 AM  Result Value Ref Range   ABO/RH(D)      O NEG Performed at [facility], [address], [place], [place] [address]   Glucose, capillary   Collection Time: 05/12/24 11:53 AM  Result Value Ref Range   Glucose-Capillary 172 (H) 70 - 99 mg/dL  Glucose, capillary   Collection Time: 05/12/24  3:58 PM  Result Value Ref Range   Glucose-Capillary 187 (H) 70 - 99 mg/dL  Glucose, capillary   Collection Time: 05/12/24  8:41 PM  Result Value Ref Range   Glucose-Capillary 208 (H) 70 - 99 mg/dL   Comment 1 Notify RN    Comment 2 Document in Chart   Glucose, capillary   Collection Time: 05/13/24  6:32 AM  Result Value Ref Range    Glucose-Capillary 143 (H) 70 - 99 mg/dL   Comment 1 Notify RN    Comment 2 Document in Chart   Glucose, capillary   Collection Time: 05/13/24 11:56 AM  Result Value Ref Range   Glucose-Capillary 146 (H) 70 - 99 mg/dL  Glucose, capillary   Collection Time: 05/13/24  4:29 PM  Result Value Ref Range   Glucose-Capillary 133 (H) 70 - 99 mg/dL  Glucose, capillary   Collection Time: 05/13/24  8:58 PM  Result Value Ref Range   Glucose-Capillary 110 (H) 70 - 99 mg/dL   Comment 1 Notify RN    Comment 2 Document in Chart   Glucose, capillary   Collection Time: 05/14/24  6:39 AM  Result Value Ref Range   Glucose-Capillary 96 70 - 99 mg/dL   Comment 1 Notify RN    Comment 2 Document in Chart     DG Lumbar Spine 2-3 Views Result Date: 05/12/2024 CLINICAL DATA:  461500 Elective surgery 461500 EXAM: LUMBAR SPINE - 2-3 VIEW COMPARISON:  None Available. FINDINGS: Several spot radiographs during the various phases of lower lumbar spinal fixation were submitted for review. There is L3 through L5 ongoing fusion with transpedicular screws and intervertebral disc spacers. Please refer to the separately issued operative report for further details. Fluoroscopy time: 72.3 seconds Radiation dose: 72.21 mGy IMPRESSION: Intraoperative fluoroscopy provided for lower lumbar spinal fixation. Electronically Signed   By: Ree Molt M.D.   On: 05/12/2024 11:15   DG C-Arm  1-60 Min-No Report Result Date: 05/12/2024 Fluoroscopy was utilized by the requesting physician.  No radiographic interpretation.   DG C-Arm 1-60 Min-No Report Result Date: 05/12/2024 Fluoroscopy was utilized by the requesting physician.  No radiographic interpretation.   DG C-Arm 1-60 Min-No Report Result Date: 05/12/2024 Fluoroscopy was utilized by the requesting physician.  No radiographic interpretation.    Antibiotics:  Anti-infectives (From admission, onward)    Start     Dose/Rate Route Frequency Ordered Stop   05/13/24 1000   fluconazole  (DIFLUCAN ) tablet 200 mg       Placed in Followed by Linked Group   200 mg Oral Daily 05/12/24 2202 05/17/24 0959   05/12/24 2300  fluconazole  (DIFLUCAN ) tablet 200 mg       Placed in Followed by Linked Group   200 mg Oral  Once 05/12/24 2202 05/12/24 2353   05/12/24 1345  ceFAZolin  (ANCEF ) IVPB 2g/100 mL premix        2 g 200 mL/hr over 30 Minutes Intravenous Every 8 hours 05/12/24 1245 05/14/24 1344   05/12/24 0600  ceFAZolin  (ANCEF ) IVPB 2g/100 mL premix        2 g 200 mL/hr over 30 Minutes Intravenous On call to O.R. 05/12/24 9441 05/12/24 9191       Discharge Exam: Blood pressure (!) 104/43, pulse 87, temperature 97.8 F (36.6 C), resp. rate 16, height 5' 1 (1.549 m), weight 85.7 kg, SpO2 97%. Neurologic: Grossly normal Ambulating and voiding well incision cdi   Discharge Medications:   Allergies as of 05/14/2024       Reactions   Sulfa Antibiotics Itching   Shellac Rash   Trazodone Rash        Medication List     STOP taking these medications    traMADol  50 MG tablet Commonly known as: ULTRAM        TAKE these medications    acetaminophen  500 MG tablet Commonly known as: TYLENOL  Take 1,000 mg by mouth every 8 (eight) hours as needed for moderate pain (pain score 4-6).   atorvastatin  20 MG tablet Commonly known as: LIPITOR Take 20 mg by mouth in the morning.   cyclobenzaprine  10 MG tablet Commonly known as: FLEXERIL  Take 1 tablet (10 mg total) by mouth 3 (three) times daily as needed for muscle spasms.   diphenhydrAMINE  25 MG tablet Commonly known as: BENADRYL  Take 25 mg by mouth at bedtime.   empagliflozin  25 MG Tabs tablet Commonly known as: JARDIANCE  Take 25 mg by mouth in the morning.   fluticasone  50 MCG/ACT nasal spray Commonly known as: FLONASE  Place 2 sprays into both nostrils daily as needed for allergies.   HYDROcodone -acetaminophen  5-325 MG tablet Commonly known as: NORCO/VICODIN Take 1 tablet by mouth every 4  (four) hours as needed for moderate pain (pain score 4-6).   losartan  25 MG tablet Commonly known as: COZAAR  Take 50 mg by mouth in the morning.   metFORMIN  500 MG 24 hr tablet Commonly known as: GLUCOPHAGE -XR Take 1,500 mg by mouth every evening.   torsemide  20 MG tablet Commonly known as: DEMADEX  Take 20 mg by mouth daily as needed for fluid.   TURMERIC CURCUMIN PO Take 3 capsules by mouth daily.        Disposition: home   Final Dx: PLIF L3-4, L4-5  Discharge Instructions     Call MD for:  difficulty breathing, headache or visual disturbances   Complete by: As directed    Call MD for:  hives   Complete by:  As directed    Call MD for:  persistant nausea and vomiting   Complete by: As directed    Call MD for:  redness, tenderness, or signs of infection (pain, swelling, redness, odor or green/yellow discharge around incision site)   Complete by: As directed    Call MD for:  severe uncontrolled pain   Complete by: As directed    Call MD for:  temperature >100.4   Complete by: As directed    Diet - low sodium heart healthy   Complete by: As directed    Driving Restrictions   Complete by: As directed    No driving for 2 weeks, no riding in the car for 1 week   Increase activity slowly   Complete by: As directed    Lifting restrictions   Complete by: As directed    No lifting more than 8 lbs   Remove dressing in 24 hours   Complete by: As directed         Contact information for after-discharge care     Destination     [facility] .   Service: Skilled Nursing Contact information: [address] [place]  [phone]                      Signed: Suzen Chiquita Pean 05/14/2024, 12:38 PM

## 2024-05-14 NOTE — Progress Notes (Signed)
 Occupational Therapy Treatment Patient Details Name: Ruth Harris MRN: [id number] DOB: Nov 05, 1945 Today's Date: 05/14/2024   History of present illness Pt is 78 yo female who presents on 05/12/24 for L3-4, L4-5 PLIF. PMH: lumbar laminectomy 2023 followed by fall in 12/23 with ankle fx, DM2, GERD, DCIS, HTN, L shoulder RTC repair   OT comments  Pt progressing toward goals, introduced LB AE to assist with LB ADLs. Pt currently needing min A for LB ADL with use of reacher (pt has at home), and min A for simulated walk-in shower transfer. Pt needs min A for bed mobility, assist to return BLE's into bed via log roll technique. Reiterated precautions with pt and pt verbalized understanding. Pt presenting with impairments listed below, will follow acutely. Patient will benefit from continued inpatient follow up therapy, <3 hours/day to maximize safety/ind with ADL/functional mobility.       If plan is discharge home, recommend the following:  A little help with walking and/or transfers;A lot of help with bathing/dressing/bathroom;Assistance with cooking/housework;Assist for transportation;Help with stairs or ramp for entrance   Equipment Recommendations  None recommended by OT    Recommendations for Other Services PT consult    Precautions / Restrictions Precautions Precautions: Fall;Back Precaution Booklet Issued: Yes (comment) Recall of Precautions/Restrictions: Intact Precaution/Restrictions Comments: reviewed precautions and proper posture and positioning Restrictions Weight Bearing Restrictions Per Provider Order: No       Mobility Bed Mobility Overal bed mobility: Needs Assistance           Sit to sidelying: Min assist General bed mobility comments: via log roll, min A for BLEs    Transfers Overall transfer level: Needs assistance Equipment used: Rolling walker (2 wheels) Transfers: Sit to/from Stand Sit to Stand: Min assist                 Balance Overall  balance assessment: Needs assistance, History of Falls Sitting-balance support: No upper extremity supported, Feet supported Sitting balance-Leahy Scale: Good     Standing balance support: Bilateral upper extremity supported, Reliant on assistive device for balance, During functional activity Standing balance-Leahy Scale: Fair Standing balance comment: static standing without AD                           ADL either performed or assessed with clinical judgement   ADL Overall ADL's : Needs assistance/impaired                     Lower Body Dressing: Minimal assistance;Sitting/lateral leans;With adaptive equipment Lower Body Dressing Details (indicate cue type and reason): donning pants with reacher Toilet Transfer: Contact guard assist;Ambulation;Rolling walker (2 wheels);Regular Toilet       Tub/ Shower Transfer: Walk-in shower;Minimal assistance;Ambulation;Rolling walker (2 wheels)   Functional mobility during ADLs: Contact guard assist;Rolling walker (2 wheels)      Extremity/Trunk Assessment Upper Extremity Assessment Upper Extremity Assessment: Generalized weakness   Lower Extremity Assessment Lower Extremity Assessment: Defer to PT evaluation        Vision   Vision Assessment?: No apparent visual deficits   Perception Perception Perception: Not tested   Praxis Praxis Praxis: Not tested   Communication Communication Communication: No apparent difficulties   Cognition Arousal: Alert Behavior During Therapy: WFL for tasks assessed/performed Cognition: No apparent impairments  Following commands: Intact        Cueing   Cueing Techniques: Verbal cues  Exercises      Shoulder Instructions       General Comments VSS    Pertinent Vitals/ Pain       Pain Assessment Pain Assessment: Faces Pain Score: 6  Faces Pain Scale: Hurts even more Pain Location: back Pain Descriptors / Indicators:  Operative site guarding, Sore Pain Intervention(s): Limited activity within patient's tolerance, Monitored during session, Repositioned  Home Living                                          Prior Functioning/Environment              Frequency  Min 2X/week        Progress Toward Goals  OT Goals(current goals can now be found in the care plan section)  Progress towards OT goals: Progressing toward goals  Acute Rehab OT Goals Patient Stated Goal: none stated OT Goal Formulation: With patient Time For Goal Achievement: 05/27/24 Potential to Achieve Goals: Good ADL Goals Pt Will Perform Upper Body Dressing: with modified independence;sitting Pt Will Perform Lower Body Dressing: with modified independence;sitting/lateral leans;sit to/from stand Pt Will Transfer to Toilet: with modified independence;ambulating;regular height toilet Pt Will Perform Tub/Shower Transfer: Shower transfer;with modified independence;ambulating  Plan      Co-evaluation                 AM-PAC OT 6 Clicks Daily Activity     Outcome Measure   Help from another person eating meals?: A Little Help from another person taking care of personal grooming?: A Little Help from another person toileting, which includes using toliet, bedpan, or urinal?: A Lot Help from another person bathing (including washing, rinsing, drying)?: A Little Help from another person to put on and taking off regular upper body clothing?: A Little Help from another person to put on and taking off regular lower body clothing?: A Little 6 Click Score: 17    End of Session Equipment Utilized During Treatment: Gait belt;Rolling walker (2 wheels)  OT Visit Diagnosis: Unsteadiness on feet (R26.81);Other abnormalities of gait and mobility (R26.89);Muscle weakness (generalized) (M62.81)   Activity Tolerance Patient tolerated treatment well   Patient Left in bed;with call bell/phone within reach;with bed  alarm set   Nurse Communication Mobility status        Time: 9178-9157 OT Time Calculation (min): 21 min  Charges: OT General Charges $OT Visit: 1 Visit OT Treatments $Self Care/Home Management : 8-22 mins  Ruth Harris, OTD, OTR/L SecureChat Preferred Acute Rehab [phone]   Ruth Harris 05/14/2024, 8:54 AM

## 2024-05-14 NOTE — Progress Notes (Signed)
 Physical Therapy Treatment Patient Details Name: Ruth Harris MRN: [id number] DOB: 14-Apr-1946 Today's Date: 05/14/2024   History of Present Illness Pt is 78 yo female who presents on 05/12/24 for L3-4, L4-5 PLIF. PMH: lumbar laminectomy 2023 followed by fall in 12/23 with ankle fx, DM2, GERD, DCIS, HTN, L shoulder RTC repair    PT Comments  Continuing work on functional mobility and activity tolerance;   Pt tired and in pain today, but willing to get up and around; notably longer amb distance today, including after going to the bathroom; Overall progressing well; Anticipate continuing good progress at post-acute rehabilitation.    If plan is discharge home, recommend the following: A little help with walking and/or transfers;A little help with bathing/dressing/bathroom;Assistance with cooking/housework;Assist for transportation;Help with stairs or ramp for entrance   Can travel by private vehicle     Yes  Equipment Recommendations  Rolling walker (2 wheels)    Recommendations for Other Services OT consult     Precautions / Restrictions Precautions Precautions: Fall;Back Precaution Booklet Issued: Yes (comment) Recall of Precautions/Restrictions: Intact Precaution/Restrictions Comments: reviewed precautions and proper posture and positioning Required Braces or Orthoses: Spinal Brace Spinal Brace: Lumbar corset (not needed per orders but MD told pt to wear for comfort PRN) Restrictions Weight Bearing Restrictions Per Provider Order: No     Mobility  Bed Mobility Overal bed mobility: Needs Assistance Bed Mobility: Rolling, Sidelying to Sit, Sit to Sidelying Rolling: Mod assist Sidelying to sit: Mod assist       General bed mobility comments: via log roll, mod A for BLEs and to elevate trunk to sit; incr time    Transfers Overall transfer level: Needs assistance Equipment used: Rolling walker (2 wheels) Transfers: Sit to/from Stand Sit to Stand: Min assist            General transfer comment: vc's for hand placement, very slow with transfers, min A to steady    Ambulation/Gait Ambulation/Gait assistance: Min assist Gait Distance (Feet): 80 Feet Assistive device: Rolling walker (2 wheels) Gait Pattern/deviations: Step-through pattern, Trunk flexed, Decreased stride length Gait velocity: decreased     General Gait Details: vc's for posture and abdominal activation. Pt fatigues quickly   Stairs             Wheelchair Mobility     Tilt Bed    Modified Rankin (Stroke Patients Only)       Balance     Sitting balance-Leahy Scale: Good     Standing balance support: Bilateral upper extremity supported, Reliant on assistive device for balance, During functional activity Standing balance-Leahy Scale: Fair Standing balance comment: static standing without AD                            Communication Communication Communication: No apparent difficulties  Cognition Arousal: Alert Behavior During Therapy: WFL for tasks assessed/performed   PT - Cognitive impairments: No apparent impairments                         Following commands: Intact      Cueing Cueing Techniques: Verbal cues  Exercises      General Comments General comments (skin integrity, edema, etc.): NAD, but fatigued at end of walk; went to bathroom first, smoother steps once done in teh bathroom      Pertinent Vitals/Pain Pain Assessment Pain Assessment: Faces Faces Pain Scale: Hurts even more Pain Location: back Pain Descriptors /  Indicators: Operative site guarding, Sore Pain Intervention(s): Limited activity within patient's tolerance    Home Living                          Prior Function            PT Goals (current goals can now be found in the care plan section) Acute Rehab PT Goals Patient Stated Goal: get stronger and independent PT Goal Formulation: With patient Time For Goal Achievement: 05/20/24 Potential  to Achieve Goals: Good Progress towards PT goals: Progressing toward goals    Frequency    Min 2X/week      PT Plan      Co-evaluation              AM-PAC PT 6 Clicks Mobility   Outcome Measure  Help needed turning from your back to your side while in a flat bed without using bedrails?: A Little Help needed moving from lying on your back to sitting on the side of a flat bed without using bedrails?: A Lot Help needed moving to and from a bed to a chair (including a wheelchair)?: A Little Help needed standing up from a chair using your arms (e.g., wheelchair or bedside chair)?: A Little Help needed to walk in hospital room?: A Little Help needed climbing 3-5 steps with a railing? : Total 6 Click Score: 15    End of Session Equipment Utilized During Treatment: Gait belt;Back brace Activity Tolerance: Patient tolerated treatment well Patient left: in bed;with call bell/phone within reach;with family/visitor present Nurse Communication: Mobility status PT Visit Diagnosis: Unsteadiness on feet (R26.81);Repeated falls (R29.6);Difficulty in walking, not elsewhere classified (R26.2);Pain Pain - part of body:  (back)     Time: 9041-8959 PT Time Calculation (min) (ACUTE ONLY): 42 min  Charges:    $Gait Training: 23-37 mins $Therapeutic Activity: 8-22 mins PT General Charges $$ ACUTE PT VISIT: 1 Visit                     Silvano Currier, PT  Acute Rehabilitation Services Office [phone] Secure Chat welcomed    Silvano VEAR Currier 05/14/2024, 10:56 AM

## 2024-05-14 NOTE — TOC Transition Note (Signed)
 Transition of Care [facility]) - Discharge Note   Patient Details  Name: Ruth Harris MRN: [id number] Date of Birth: 09-Nov-1945  Transition of Care [facility]) CM/SW Contact:  Almarie CHRISTELLA Goodie, LCSW Phone Number: 05/14/2024, 1:19 PM   Clinical Narrative:   Patient received insurance authorization to admit to SNF, auth ID# [id number]. CSW updated [facility], they have a bed available for patient today. CSW spoke with son, Beryl, he is in agreement and will provide transport. No further TOC needs at this time.  Nurse to call report to [phone], Room 109. Room ready after 4 pm.    Final next level of care: Skilled Nursing Facility Barriers to Discharge: Barriers Resolved   Patient Goals and CMS Choice Patient states their goals for this hospitalization and ongoing recovery are:: get rehab at [facility].gov Compare Post Acute Care list provided to:: Patient Choice offered to / list presented to : Patient [place] ownership interest in [facility].provided to:: Patient    Discharge Placement              Patient chooses bed at: [facility] Patient to be transferred to facility by: Family Name of family member notified: Beryl, son Patient and family notified of of transfer: 05/14/24  Discharge Plan and Services Additional resources added to the After Visit Summary for       Post Acute Care Choice: Skilled Nursing Facility                               Social Drivers of Health (SDOH) Interventions SDOH Screenings   Food Insecurity: No Food Insecurity (07/18/2023)   Received from [facility]  Housing: Unknown (01/09/2024)   Received from [facility]  Transportation Needs: No Transportation Needs (07/18/2023)   Received from [facility]  Utilities: Not At Risk (07/18/2023)   Received from [facility]  Financial Resource Strain: Low Risk  (07/18/2023)   Received from [facility]  Physical Activity: Unknown (09/09/2018)   Received from [facility]  Tobacco Use: Low Risk  (05/12/2024)     Readmission Risk Interventions     No data to display

## 2024-05-14 NOTE — Plan of Care (Signed)
 Pt doing well. Report called to nurse at [facility]. Pt's incision is clean and dry with no sign of infection. Pt's incision is clean and dry with no sign of infection. Pt's IV and Hemovac were removed prior to D/C. Pt D/C'd to SNF via wheelchair per MD order. Pt is stable @ D/C and has no other needs at this time. Rosina Rakers, RN

## 2024-05-16 ENCOUNTER — Non-Acute Institutional Stay (SKILLED_NURSING_FACILITY): Payer: Self-pay | Admitting: Student

## 2024-05-16 ENCOUNTER — Encounter: Payer: Self-pay | Admitting: Student

## 2024-05-16 DIAGNOSIS — Z981 Arthrodesis status: Secondary | ICD-10-CM

## 2024-05-16 DIAGNOSIS — M48061 Spinal stenosis, lumbar region without neurogenic claudication: Secondary | ICD-10-CM

## 2024-05-16 DIAGNOSIS — Z6835 Body mass index (BMI) 35.0-35.9, adult: Secondary | ICD-10-CM | POA: Diagnosis not present

## 2024-05-16 DIAGNOSIS — J4520 Mild intermittent asthma, uncomplicated: Secondary | ICD-10-CM | POA: Diagnosis not present

## 2024-05-16 DIAGNOSIS — M8580 Other specified disorders of bone density and structure, unspecified site: Secondary | ICD-10-CM | POA: Diagnosis not present

## 2024-05-16 DIAGNOSIS — N182 Chronic kidney disease, stage 2 (mild): Secondary | ICD-10-CM

## 2024-05-16 DIAGNOSIS — E1122 Type 2 diabetes mellitus with diabetic chronic kidney disease: Secondary | ICD-10-CM

## 2024-05-16 NOTE — Progress Notes (Signed)
 Provider:  Dr. Richerd Brigham Location:  Other [place].  Nursing Home Room Number: [facility] [address] Place of Service:  SNF ([phone])  PCP: Lenon Layman ORN, MD Patient Care Team: Lenon Layman ORN, MD as PCP - General (Internal Medicine)  Extended Emergency Contact Information Primary Emergency Contact: Panos,Bobby L Address: [address]          [place], [place] [address] [place] Home Phone: [phone] Relation: Son  Code Status: Full Code.  Goals of Care: Advanced Directive information    05/07/2024   10:19 AM  Advanced Directives  Does Patient Have a Medical Advance Directive? Yes  Type of Advance Directive Living will;Healthcare Power of Attorney  Copy of Healthcare Power of Attorney in Chart? No - copy requested      Chief Complaint  Patient presents with   New Admit To SNF    Admission.     HPI: Patient is a 78 y.o. female seen today for admission to [facility]  History of Present Illness The patient presents for follow-up after spinal fusion surgery for arthritis and sciatica.  She underwent spinal fusion surgery involving the placement of two baskets, six cribs, and two rods due to persistent sciatica with numbness in her right leg. Prior treatments, including chiropractic care, physical therapy, and injections, were ineffective. She had a previous back surgery two years ago, which was not a spinal fusion. Three months post-surgery, she fell and broke her ankle in December 2023.  Currently, she is taking hydrocodone  for pain management, which she finds helpful. Prior to surgery, she was taking tramadol , which provided some relief. She has not had a bowel movement since Sunday and has been taking Miralax and stool softeners without success, attributing constipation to hydrocodone  use. She is also taking Tylenol  between doses of hydrocodone .  She has a history of bronchitis, with symptoms of wheezing and coughing that do not produce  sputum. She has not taken any medication for wheezing recently but has used medication for bronchitis in the past. She experiences shortness of breath after minimal exertion, such as walking around a grocery store.  Her past medical history includes acid reflux, diabetes, B12 deficiency, and a history of ankle swelling for which she takes torsemide . She manages her medications, which include Jardiance , losartan , metformin , and Flonase . She has been taking Benadryl  nightly for sleep without experiencing dry mouth or other side effects until after her recent surgery.  Socially, she lives alone with her 55 year old dog and has family in [place], including a son and grandchildren. Her son helps with yard work and tasks she cannot manage. She has not been able to cook or clean much in the past year due to her back issues. 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 She tries to stay active by walking around the grocery store, although she has been limited by her back pain and shortness of breath.  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 The patient has not been cooking or cleaning much in the last year due to back issues. The patient takes grandchildren to various places like the [address], mountains, and [address]    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 No issues.   Hypertension    PONV (postoperative nausea and vomiting)    Seasonal allergies    Past Surgical History:  Procedure Laterality Date   BREAST SURGERY Right    DCIS, lumpectomy   CATARACT EXTRACTION W/PHACO Left 05/26/2015   Procedure: CATARACT EXTRACTION PHACO AND INTRAOCULAR LENS PLACEMENT (IOC);  Surgeon: Dene Etienne, MD;  Location: [facility];  Service: Ophthalmology;  Laterality: Left;  DIABETIC   CATARACT EXTRACTION W/PHACO Right 06/09/2015   Procedure: CATARACT EXTRACTION PHACO AND INTRAOCULAR LENS PLACEMENT (IOC);  Surgeon: Dene Etienne, MD;  Location: [facility];  Service: Ophthalmology;  Laterality: Right;  DIABETIC - oral meds   COLONOSCOPY WITH PROPOFOL  N/A 12/09/2015   Procedure: COLONOSCOPY WITH PROPOFOL ;  Surgeon: Lamar ONEIDA Holmes, MD;  Location: [facility] ENDOSCOPY;  Service: Endoscopy;  Laterality: N/A;   HAMMER TOE SURGERY Right    LUMBAR LAMINECTOMY/DECOMPRESSION MICRODISCECTOMY Bilateral 07/07/2022   Procedure: Sublaminar decompression - L3-L4 - L4-L5 - bilateral;  Surgeon: Onetha Kuba, MD;  Location: [facility] OR;  Service: Neurosurgery;  Laterality: Bilateral;  3C   SHOULDER ARTHROSCOPY WITH OPEN ROTATOR CUFF REPAIR Left 12/19/2018   Procedure: LEFT SHOULDER ARTHROSCOPY WITH SUBACROMIAL DECOMPRESSION, DISTAL CLAVICLE EXCISION, MINI OPEN ROTATOR CUFF REPAIR;  Surgeon: Marchia Drivers, MD;  Location: [facility] ORS;  Service: Orthopedics;  Laterality: Left;   TOE SURGERY Right    corn removed from 5th toe   TONSILLECTOMY     TUBAL LIGATION      reports that she has never smoked. She has never used smokeless tobacco. She reports that she does not drink alcohol and does not use drugs. Social History    Socioeconomic History   Marital status: Widowed    Spouse name: Not on file   Number of children: Not on file   Years of education: Not on file   Highest education level: Not on file  Occupational History   Not on file  Tobacco Use   Smoking status: Never   Smokeless tobacco: Never  Vaping Use   Vaping status: Never Used  Substance and Sexual Activity   Alcohol use: No    Alcohol/week: 0.0 standard drinks of alcohol   Drug use: No   Sexual activity: Not on file  Other Topics Concern   Not on file  Social History Narrative   Not on file   Social Drivers of Health   Financial Resource Strain: Low Risk  (07/18/2023)   Received from [facility]   Overall Financial Resource Strain (CARDIA)    Difficulty of Paying Living Expenses: Not hard at all  Food Insecurity: No Food Insecurity (07/18/2023)   Received from [facility]   Hunger Vital Sign    Within the past 12 months, you worried that your food would  run out before you got the money to buy more.: Never true    Within the past 12 months, the food you bought just didn't last and you didn't have money to get more.: Never true  Transportation Needs: No Transportation Needs (07/18/2023)   Received from [facility] - Transportation    In the past 12 months, has lack of transportation kept you from medical appointments or from getting medications?: No    Lack of Transportation (Non-Medical): No  Physical Activity: Unknown (09/09/2018)   Received from [facility]   Exercise Vital Sign    On average, how many days per week do you engage in moderate to strenuous exercise (like a brisk walk)?: 0 days    Minutes of Exercise per Session: Not on file  Stress: Not on file  Social Connections: Not on file  Intimate Partner Violence: Not on file    Functional Status Survey:    History reviewed. No pertinent family history.  Health Maintenance  Topic  Date Due   Medicare Annual Wellness (AWV)  Never done   Diabetic kidney evaluation - Urine ACR  Never done   Hepatitis C Screening  Never done   DTaP/Tdap/Td (1 - Tdap) Never done   Zoster Vaccines- Shingrix (1 of 2) Never done   DEXA SCAN  Never done   COVID-19 Vaccine (4 - 2024-25 season) 06/10/2023   INFLUENZA VACCINE  05/09/2024   Diabetic kidney evaluation - eGFR measurement  05/07/2025   Pneumococcal Vaccine: 50+ Years  Completed   Hepatitis B Vaccines  Aged Out   HPV VACCINES  Aged Out   Meningococcal B Vaccine  Aged Out   Colonoscopy  Discontinued    Allergies  Allergen Reactions   Sulfa Antibiotics Itching   Shellac Rash   Trazodone Rash    Outpatient Encounter Medications as of 05/16/2024  Medication Sig   acetaminophen  (TYLENOL ) 500 MG tablet Take 1,000 mg by mouth every 8 (eight) hours as needed for moderate pain (pain score 4-6).   atorvastatin  (LIPITOR) 20 MG tablet Take 20 mg by mouth in the morning.   cyclobenzaprine  (FLEXERIL ) 10 MG tablet Take 1 tablet (10 mg total) by mouth 3 (three) times daily as needed for muscle spasms.   diphenhydrAMINE  (BENADRYL ) 25 MG tablet Take 25 mg by mouth at bedtime.   empagliflozin  (JARDIANCE ) 25 MG TABS tablet Take 25 mg by mouth in the morning.   fluticasone  (FLONASE ) 50 MCG/ACT nasal spray Place 2 sprays into both nostrils daily as needed for allergies.   HYDROcodone -acetaminophen  (NORCO/VICODIN) 5-325 MG tablet Take 1 tablet by mouth every 4 (four) hours as needed for moderate pain (pain score 4-6).   losartan  (COZAAR ) 25 MG tablet Take 50 mg by mouth in the morning.   metFORMIN  (GLUCOPHAGE -XR) 500 MG 24 hr tablet Take 1,500 mg by mouth every evening.   polyethylene glycol (MIRALAX / GLYCOLAX) 17 g packet Take 17 g by mouth 2 (two) times daily as needed.   torsemide  (DEMADEX ) 20 MG tablet Take 20 mg by mouth daily as needed for fluid.   TURMERIC CURCUMIN PO Take 3 capsules by mouth daily.   No facility-administered encounter  medications on file as of 05/16/2024.    Review of Systems  Vitals:   05/16/24 0842  BP: 135/61  Pulse: (!) 104  Resp: 20  Temp: (!) 97.3 F (36.3 C)  SpO2: 96%  Weight: 198 lb 9.6 oz (90.1 kg)  Height: 5' 1 (1.549  m)   Body mass index is 37.53 kg/m. Physical Exam Physical Exam CHEST: No wheezing on auscultation. EXTREMITIES: Right leg more swollen than left.  Labs reviewed: Basic Metabolic Panel: Recent Labs    05/07/24 1100  NA 138  K 4.3  CL 105  CO2 19*  GLUCOSE 120*  BUN 16  CREATININE 0.91  CALCIUM  9.6   Liver Function Tests: No results for input(s): AST, ALT, ALKPHOS, BILITOT, PROT, ALBUMIN  in the last 8760 hours. No results for input(s): LIPASE, AMYLASE in the last 8760 hours. No results for input(s): AMMONIA in the last 8760 hours. CBC: Recent Labs    05/07/24 1100  WBC 9.4  HGB 13.6  HCT 43.5  MCV 93.5  PLT 282   Cardiac Enzymes: No results for input(s): CKTOTAL, CKMB, CKMBINDEX, TROPONINI in the last 8760 hours. BNP: Invalid input(s): POCBNP Lab Results  Component Value Date   HGBA1C 6.5 (H) 05/07/2024   No results found for: TSH No results found for: VITAMINB12 No results found for: FOLATE No results found for: IRON, TIBC, FERRITIN  Imaging and Procedures obtained prior to SNF admission: DG Lumbar Spine 2-3 Views Result Date: 05/12/2024 CLINICAL DATA:  461500 Elective surgery 461500 EXAM: LUMBAR SPINE - 2-3 VIEW COMPARISON:  None Available. FINDINGS: Several spot radiographs during the various phases of lower lumbar spinal fixation were submitted for review. There is L3 through L5 ongoing fusion with transpedicular screws and intervertebral disc spacers. Please refer to the separately issued operative report for further details. Fluoroscopy time: 72.3 seconds Radiation dose: 72.21 mGy IMPRESSION: Intraoperative fluoroscopy provided for lower lumbar spinal fixation. Electronically Signed   By: Ree Molt M.D.   On: 05/12/2024 11:15   DG C-Arm 1-60 Min-No Report Result Date: 05/12/2024 Fluoroscopy was utilized by the requesting physician.  No radiographic interpretation.   DG C-Arm 1-60 Min-No Report Result Date: 05/12/2024 Fluoroscopy was utilized by the requesting physician.  No radiographic interpretation.   DG C-Arm 1-60 Min-No Report Result Date: 05/12/2024 Fluoroscopy was utilized by the requesting physician.  No radiographic interpretation.    Assessment/Plan Postoperative care following lumbar spinal fusion Postoperative care following PLIF of L3, 4, and 5 performed on May 12, 2024. The procedure was well-tolerated, and the surgical wound was in good condition at discharge. She is experiencing postoperative pain, managed with hydrocodone , and is advised to increase activity gradually. No back brace is required while in bed, but it should be worn when ambulating. 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 The recent spinal fusion was performed to address this issue.  Constipation Constipation likely secondary to opioid use (hydrocodone ) post-surgery. No bowel movement since Sunday despite use of Miralax and stool softeners. Discussed the use of a suppository to alleviate constipation. - Administer suppository to relieve constipation - Continue Miralax and stool softeners - Initiate Senna, two tablets nightly  Lower extremity edema, right greater than left Chronic lower extremity edema, right greater than left, noted since ankle fracture. Managed with torsemide . - Continue torsemide  as prescribed  Airway hyperreactivity Airway hyperreactivity with  symptoms exacerbated by certain inhalants. No current wheezing noted. Postoperative cough present but non-productive. - albuterol q6 prn  Type 2 diabetes mellitus Type 2 diabetes mellitus managed with  Jardiance  and metformin .  Hypertension Hypertension managed with losartan .  General Health Maintenance Discussed the importance of hydration and protein intake to support recovery and prevent complications such as infection and skin integrity issues. - Encourage intake of at least six cups of water per day - Recommend 30 grams of protein per meal to support muscle strength  Goals of Care Discussed advance directives and her preference for no resuscitation in the event of cardiac arrest. She expressed a desire for comfort measures only if severely ill and not to be transferred to the hospital. She has been widowed for 22 years and feels strongly about her decision, despite her son's potential differing opinion. - Complete and sign do not resuscitate (DNR) order - Document her preference for comfort measures only  Family/ staff Communication: Spoke to patient's son and DIL  Labs/tests ordered:CBC, BMP

## 2024-05-17 ENCOUNTER — Encounter: Payer: Self-pay | Admitting: Student

## 2024-05-17 MED ORDER — HYDROCODONE-ACETAMINOPHEN 5-325 MG PO TABS: 1.0000 | ORAL_TABLET | Freq: Four times a day (QID) | ORAL | 0 refills | Status: AC | PRN

## 2024-05-19 LAB — BASIC METABOLIC PANEL WITH GFR
BUN: 18 (ref 4–21)
CO2: 27 — AB (ref 13–22)
Chloride: 102 (ref 99–108)
Creatinine: 0.9 (ref 0.5–1.1)
Glucose: 95
Potassium: 4.6 meq/L (ref 3.5–5.1)
Sodium: 138 (ref 137–147)

## 2024-05-19 LAB — COMPREHENSIVE METABOLIC PANEL WITH GFR
Calcium: 8.7 (ref 8.7–10.7)
eGFR: 64

## 2024-05-19 LAB — CBC AND DIFFERENTIAL
HCT: 27 — AB (ref 36–46)
Hemoglobin: 8.5 — AB (ref 12.0–16.0)
Neutrophils Absolute: 8330
Platelets: 398 K/uL (ref 150–400)
WBC: 10.4

## 2024-05-19 LAB — CBC: RBC: 2.98 — AB (ref 3.87–5.11)

## 2024-05-21 LAB — CBC AND DIFFERENTIAL
HCT: 27 — AB (ref 36–46)
Hemoglobin: 8.4 — AB (ref 12.0–16.0)
Neutrophils Absolute: 5675
Platelets: 425 K/uL — AB (ref 150–400)
WBC: 7.7

## 2024-05-21 LAB — CBC: RBC: 2.91 — AB (ref 3.87–5.11)

## 2024-05-27 ENCOUNTER — Encounter: Payer: Self-pay | Admitting: Nurse Practitioner

## 2024-05-27 ENCOUNTER — Non-Acute Institutional Stay (SKILLED_NURSING_FACILITY): Payer: Self-pay | Admitting: Nurse Practitioner

## 2024-05-27 DIAGNOSIS — M48061 Spinal stenosis, lumbar region without neurogenic claudication: Secondary | ICD-10-CM | POA: Diagnosis not present

## 2024-05-27 DIAGNOSIS — M8580 Other specified disorders of bone density and structure, unspecified site: Secondary | ICD-10-CM

## 2024-05-27 DIAGNOSIS — Z6835 Body mass index (BMI) 35.0-35.9, adult: Secondary | ICD-10-CM | POA: Diagnosis not present

## 2024-05-27 DIAGNOSIS — N182 Chronic kidney disease, stage 2 (mild): Secondary | ICD-10-CM | POA: Diagnosis not present

## 2024-05-27 DIAGNOSIS — E1122 Type 2 diabetes mellitus with diabetic chronic kidney disease: Secondary | ICD-10-CM | POA: Diagnosis not present

## 2024-05-27 DIAGNOSIS — J4520 Mild intermittent asthma, uncomplicated: Secondary | ICD-10-CM | POA: Diagnosis not present

## 2024-05-27 DIAGNOSIS — G4700 Insomnia, unspecified: Secondary | ICD-10-CM | POA: Diagnosis not present

## 2024-05-27 DIAGNOSIS — D649 Anemia, unspecified: Secondary | ICD-10-CM | POA: Diagnosis not present

## 2024-05-27 DIAGNOSIS — K5900 Constipation, unspecified: Secondary | ICD-10-CM | POA: Diagnosis not present

## 2024-05-27 DIAGNOSIS — Z981 Arthrodesis status: Secondary | ICD-10-CM

## 2024-05-27 NOTE — Progress Notes (Deleted)
 Location:      Place of Service:     Lenon Layman ORN, MD  Patient Care Team: Lenon Layman ORN, MD as PCP - General (Internal Medicine)  Extended Emergency Contact Information Primary Emergency Contact: Hemmer,Bobby L Address: [address]          [place], [place] [address] [place] Home Phone: [phone] Relation: Son  Goals of care: Advanced Directive information    05/07/2024   10:19 AM  Advanced Directives  Does Patient Have a Medical Advance Directive? Yes  Type of Advance Directive Living will;Healthcare Power of Attorney  Copy of Healthcare Power of Attorney in Chart? No - copy requested     No chief complaint on file.   HPI:  Pt is a 78 y.o. 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 No issues.   Hypertension    PONV (postoperative nausea and vomiting)    Seasonal allergies    Past Surgical History:  Procedure Laterality Date   BREAST SURGERY Right    DCIS, lumpectomy   CATARACT EXTRACTION W/PHACO Left 05/26/2015   Procedure: CATARACT EXTRACTION PHACO AND INTRAOCULAR LENS PLACEMENT (IOC);  Surgeon: Dene Etienne, MD;  Location: [facility];  Service: Ophthalmology;  Laterality: Left;  DIABETIC   CATARACT EXTRACTION W/PHACO Right 06/09/2015   Procedure: CATARACT EXTRACTION PHACO AND INTRAOCULAR LENS PLACEMENT (IOC);  Surgeon: Dene Etienne, MD;  Location: [facility];  Service: Ophthalmology;  Laterality: Right;  DIABETIC - oral meds   COLONOSCOPY WITH PROPOFOL  N/A 12/09/2015   Procedure: COLONOSCOPY WITH PROPOFOL ;  Surgeon: Lamar ONEIDA Holmes, MD;  Location: [facility] ENDOSCOPY;  Service: Endoscopy;   Laterality: N/A;   HAMMER TOE SURGERY Right    LUMBAR LAMINECTOMY/DECOMPRESSION MICRODISCECTOMY Bilateral 07/07/2022   Procedure: Sublaminar decompression - L3-L4 - L4-L5 - bilateral;  Surgeon: Onetha Kuba, MD;  Location: [facility] OR;  Service: Neurosurgery;  Laterality: Bilateral;  3C   SHOULDER ARTHROSCOPY WITH OPEN ROTATOR CUFF REPAIR Left 12/19/2018   Procedure: LEFT SHOULDER ARTHROSCOPY WITH SUBACROMIAL DECOMPRESSION, DISTAL CLAVICLE EXCISION, MINI OPEN ROTATOR CUFF REPAIR;  Surgeon: Marchia Drivers, MD;  Location: [facility] ORS;  Service: Orthopedics;  Laterality: Left;   TOE SURGERY Right    corn removed from 5th toe   TONSILLECTOMY     TUBAL LIGATION      Allergies  Allergen Reactions   Sulfa Antibiotics Itching   Shellac Rash   Trazodone Rash    Outpatient Encounter Medications as of 05/27/2024  Medication Sig   acetaminophen  (TYLENOL ) 500 MG tablet Take 1,000 mg by mouth every 8 (eight) hours as needed for moderate pain (pain score 4-6).   atorvastatin  (LIPITOR) 20 MG tablet Take 20 mg by mouth in the morning.   cyclobenzaprine  (FLEXERIL ) 10 MG tablet Take 1 tablet (10 mg total) by mouth 3 (three) times daily as needed for muscle spasms.   empagliflozin  (JARDIANCE ) 25 MG TABS tablet Take 25 mg by mouth in the morning.   fluticasone  (FLONASE ) 50 MCG/ACT nasal spray Place 2 sprays into both nostrils daily as needed for allergies.   HYDROcodone -acetaminophen  (NORCO/VICODIN) 5-325  MG tablet Take 1 tablet by mouth every 6 (six) hours as needed for moderate pain (pain score 4-6).   losartan  (COZAAR ) 25 MG tablet Take 50 mg by mouth in the morning.   metFORMIN  (GLUCOPHAGE -XR) 500 MG 24 hr tablet Take 1,500 mg by mouth every evening.   polyethylene glycol (MIRALAX / GLYCOLAX) 17 g packet Take 17 g by mouth 2 (two) times daily as needed.   torsemide  (DEMADEX ) 20 MG tablet Take 20 mg by mouth daily as needed for fluid.   TURMERIC CURCUMIN PO Take 3 capsules by mouth daily.   No  facility-administered encounter medications on file as of 05/27/2024.    Review of Systems***  Immunization History  Administered Date(s) Administered   Influenza-Unspecified 07/06/2016, 08/13/2017, 07/05/2018, 07/25/2019   PFIZER Comirnaty(Gray Top)Covid-19 Tri-Sucrose Vaccine 05/22/2020, 06/12/2020, 12/08/2020   Pneumococcal Conjugate-13 05/09/2017   Pneumococcal Polysaccharide-23 06/26/2012   Pertinent  Health Maintenance Due  Topic Date Due   FOOT EXAM  Never done   OPHTHALMOLOGY EXAM  Never done   DEXA SCAN  Never done   INFLUENZA VACCINE  05/09/2024   HEMOGLOBIN A1C  11/07/2024   Colonoscopy  Discontinued      07/07/2022    8:10 AM 07/07/2022    2:54 PM 07/07/2022    9:00 PM  Fall Risk  (RETIRED) Patient Fall Risk Level Moderate fall risk  Moderate fall risk  Moderate fall risk      Data saved with a previous flowsheet row definition   Functional Status Survey:    There were no vitals filed for this visit. There is no height or weight on file to calculate BMI. Physical Exam***  Labs reviewed: Recent Labs    05/07/24 1100  NA 138  K 4.3  CL 105  CO2 19*  GLUCOSE 120*  BUN 16  CREATININE 0.91  CALCIUM  9.6   No results for input(s): AST, ALT, ALKPHOS, BILITOT, PROT, ALBUMIN  in the last 8760 hours. Recent Labs    05/07/24 1100  WBC 9.4  HGB 13.6  HCT 43.5  MCV 93.5  PLT 282   No results found for: TSH Lab Results  Component Value Date   HGBA1C 6.5 (H) 05/07/2024   No results found for: CHOL, HDL, LDLCALC, LDLDIRECT, TRIG, CHOLHDL  Significant Diagnostic Results in last 30 days:  DG Lumbar Spine 2-3 Views Result Date: 05/12/2024 CLINICAL DATA:  461500 Elective surgery 461500 EXAM: LUMBAR SPINE - 2-3 VIEW COMPARISON:  None Available. FINDINGS: Several spot radiographs during the various phases of lower lumbar spinal fixation were submitted for review. There is L3 through L5 ongoing fusion with transpedicular screws and  intervertebral disc spacers. Please refer to the separately issued operative report for further details. Fluoroscopy time: 72.3 seconds Radiation dose: 72.21 mGy IMPRESSION: Intraoperative fluoroscopy provided for lower lumbar spinal fixation. Electronically Signed   By: Ree Molt M.D.   On: 05/12/2024 11:15   DG C-Arm 1-60 Min-No Report Result Date: 05/12/2024 Fluoroscopy was utilized by the requesting physician.  No radiographic interpretation.   DG C-Arm 1-60 Min-No Report Result Date: 05/12/2024 Fluoroscopy was utilized by the requesting physician.  No radiographic interpretation.   DG C-Arm 1-60 Min-No Report Result Date: 05/12/2024 Fluoroscopy was utilized by the requesting physician.  No radiographic interpretation.    Assessment/Plan No problem-specific Assessment & Plan notes found for this encounter.    Uziah Sorter K. Caro BODILY [facility] [phone]

## 2024-05-27 NOTE — Progress Notes (Signed)
 Location:  Other Nursing Home Room Number: [facility] [id number] Place of Service:  SNF (31)  Lenon Layman ORN, MD  Patient Care Team: Lenon Layman ORN, MD as PCP - General (Internal Medicine)  Extended Emergency Contact Information Primary Emergency Contact: Rosekrans,Bobby L Address: [address]          [place], [place] [address] [place] Home Phone: [phone] Relation: Son  Goals of care: Advanced Directive information    05/07/2024   10:19 AM  Advanced Directives  Does Patient Have a Medical Advance Directive? Yes  Type of Advance Directive Living will;Healthcare Power of Attorney  Copy of Healthcare Power of Attorney in Chart? No - copy requested    Chief Complaint  Patient presents with   Discharge Note    Discharge.     HPI:  Pt is a 78 y.o. female seen today for planned discharge home tomorrow.  Pt is at [facility] for short term rehab after  PLIf L3-4, L4-5.  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  She is alert and oriented and answers all questions appropriately. She expresses her relief and joy with being able to return home with her dog.  Today she is following up with surgeon post back surgery. Her son will be driving her to her appointment.  She is scheduled to see her PCP on Monday.  She states her cousin will be staying with her for a few days after she goes home to help out. Her son assists her with grocery shopping which mainly includes prepackaged meals.   She is able to ambulate independently with a walker, and she is wearing her back brace when up and out of bed. She states she is able to put on and take off back brace independently.   Her pain is well controlled with tylenol . She denies any issues with sleep. She is having loose stools and is no longer constipated. She denies any blood in her stool.  Her Hgb is noted to be decreased from post-operative level  however there is no signs of bleeding.  She denies blood in stools, vaginal bleeding. No shortness of breath, chest pains, abnormal bleeding or bruising.   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 No issues.   Hypertension    PONV (postoperative nausea and vomiting)    Seasonal allergies    Past Surgical History:  Procedure Laterality Date   BREAST SURGERY Right    DCIS, lumpectomy   CATARACT EXTRACTION W/PHACO Left 05/26/2015   Procedure: CATARACT EXTRACTION PHACO AND INTRAOCULAR LENS PLACEMENT (IOC);  Surgeon: Dene Etienne, MD;  Location: [facility];  Service: Ophthalmology;  Laterality: Left;  DIABETIC   CATARACT EXTRACTION W/PHACO Right 06/09/2015   Procedure: CATARACT EXTRACTION PHACO AND INTRAOCULAR LENS PLACEMENT (IOC);  Surgeon: Dene Etienne, MD;  Location: [facility];  Service: Ophthalmology;  Laterality: Right;  DIABETIC - oral meds   COLONOSCOPY WITH PROPOFOL  N/A 12/09/2015   Procedure: COLONOSCOPY WITH PROPOFOL ;  Surgeon: Lamar ONEIDA Holmes, MD;  Location: [facility] ENDOSCOPY;  Service: Endoscopy;  Laterality: N/A;   HAMMER TOE  SURGERY Right    LUMBAR LAMINECTOMY/DECOMPRESSION MICRODISCECTOMY Bilateral 07/07/2022   Procedure: Sublaminar decompression - L3-L4 - L4-L5 - bilateral;  Surgeon: Onetha Kuba, MD;  Location: [facility] OR;  Service: Neurosurgery;  Laterality: Bilateral;  3C   SHOULDER ARTHROSCOPY WITH OPEN ROTATOR CUFF REPAIR Left 12/19/2018   Procedure: LEFT SHOULDER ARTHROSCOPY WITH SUBACROMIAL DECOMPRESSION, DISTAL CLAVICLE EXCISION, MINI OPEN ROTATOR CUFF REPAIR;  Surgeon: Marchia Drivers, MD;  Location: [facility] ORS;  Service: Orthopedics;  Laterality: Left;   TOE SURGERY Right     corn removed from 5th toe   TONSILLECTOMY     TUBAL LIGATION     Allergies  Allergen Reactions   Sulfa Antibiotics Itching   Shellac Rash   Trazodone Rash   Outpatient Encounter Medications as of 05/27/2024  Medication Sig   acetaminophen  (TYLENOL ) 500 MG tablet Take 1,000 mg by mouth every 8 (eight) hours as needed for moderate pain (pain score 4-6).   albuterol (VENTOLIN HFA) 108 (90 Base) MCG/ACT inhaler Inhale 2 puffs into the lungs every 6 (six) hours as needed for wheezing or shortness of breath.   atorvastatin  (LIPITOR) 20 MG tablet Take 20 mg by mouth in the morning.   bisacodyl  (DULCOLAX) 10 MG suppository Place 10 mg rectally daily as needed for moderate constipation.   cyclobenzaprine  (FLEXERIL ) 10 MG tablet Take 1 tablet (10 mg total) by mouth 3 (three) times daily as needed for muscle spasms.   empagliflozin  (JARDIANCE ) 25 MG TABS tablet Take 25 mg by mouth in the morning.   fluticasone  (FLONASE ) 50 MCG/ACT nasal spray Place 2 sprays into both nostrils daily as needed for allergies.   HYDROcodone -acetaminophen  (NORCO/VICODIN) 5-325 MG tablet Take 1 tablet by mouth every 6 (six) hours as needed for moderate pain (pain score 4-6).   losartan  (COZAAR ) 25 MG tablet Take 50 mg by mouth in the morning.   metFORMIN  (GLUCOPHAGE -XR) 500 MG 24 hr tablet Take 1,500 mg by mouth every evening.   polyethylene glycol (MIRALAX / GLYCOLAX) 17 g packet Take 17 g by mouth 2 (two) times daily as needed.   ramelteon (ROZEREM) 8 MG tablet Take 8 mg by mouth at bedtime.   senna (SENOKOT) 8.6 MG TABS tablet Take 2 tablets by mouth at bedtime.   torsemide  (DEMADEX ) 20 MG tablet Take 20 mg by mouth daily as needed for fluid. (Patient taking differently: Take 20 mg by mouth daily. Take one tablet by mouth every 24 hours as needed 3lbs in 24hrs or 5lbs in one week.)   TURMERIC CURCUMIN PO Take 3 capsules by mouth daily. (Patient taking differently: Take 2 capsules by mouth daily.)   No  facility-administered encounter medications on file as of 05/27/2024.   Review of Systems  Constitutional: Negative.   HENT: Negative.    Respiratory: Negative.    Cardiovascular:  Positive for leg swelling.  Gastrointestinal:  Positive for diarrhea. Negative for constipation.  Genitourinary: Negative.   Musculoskeletal:  Positive for back pain.  Skin: Negative.    Immunization History  Administered Date(s) Administered   Influenza-Unspecified 07/06/2016, 08/13/2017, 07/05/2018, 07/25/2019   PFIZER Comirnaty(Gray Top)Covid-19 Tri-Sucrose Vaccine 05/22/2020, 06/12/2020, 12/08/2020   Pneumococcal Conjugate-13 05/09/2017   Pneumococcal Polysaccharide-23 06/26/2012   Pertinent  Health Maintenance Due  Topic Date Due   FOOT EXAM  Never done   OPHTHALMOLOGY EXAM  Never done   DEXA SCAN  Never done   INFLUENZA VACCINE  05/09/2024   HEMOGLOBIN A1C  11/07/2024   Colonoscopy  Discontinued      07/07/2022  8:10 AM 07/07/2022    2:54 PM 07/07/2022    9:00 PM  Fall Risk  (RETIRED) Patient Fall Risk Level Moderate fall risk  Moderate fall risk  Moderate fall risk      Data saved with a previous flowsheet row definition   Functional Status Survey:   Vitals:   05/27/24 1002  BP: 127/66  Pulse: (!) 105  Resp: 14  Temp: (!) 97.3 F (36.3 C)  SpO2: 96%  Weight: 187 lb 3.2 oz (84.9 kg)  Height: 5' 1 (1.549 m)   Body mass index is 35.37 kg/m. Physical Exam Vitals reviewed.  Constitutional:      Appearance: Normal appearance. She is normal weight.  HENT:     Head: Normocephalic and atraumatic.     Mouth/Throat:     Mouth: Mucous membranes are moist.     Pharynx: Oropharynx is clear.  Eyes:     Conjunctiva/sclera: Conjunctivae normal.  Cardiovascular:     Rate and Rhythm: Normal rate and regular rhythm.     Pulses: Normal pulses.     Heart sounds: Normal heart sounds.  Pulmonary:     Effort: Pulmonary effort is normal.     Breath sounds: Normal breath sounds.   Abdominal:     General: Bowel sounds are normal.     Palpations: Abdomen is soft.     Tenderness: There is no abdominal tenderness.  Musculoskeletal:     Cervical back: Normal range of motion.     Right lower leg: Edema present.     Left lower leg: Edema present.     Comments: Wearing back brace when up  Skin:    General: Skin is warm and dry.  Neurological:     Mental Status: She is alert and oriented to person, place, and time. Mental status is at baseline.     Motor: Motor function is intact.  Psychiatric:        Mood and Affect: Mood normal.        Behavior: Behavior normal.    Labs reviewed: Recent Labs    05/07/24 1100 05/19/24 0000  NA 138 138  K 4.3 4.6  CL 105 102  CO2 19* 27*  GLUCOSE 120*  --   BUN 16 18  CREATININE 0.91 0.9  CALCIUM  9.6 8.7   No results for input(s): AST, ALT, ALKPHOS, BILITOT, PROT, ALBUMIN  in the last 8760 hours. Recent Labs    05/07/24 1100 05/19/24 0000 05/21/24 0000  WBC 9.4 10.4 7.7  NEUTROABS  --  8,330.00 5,675.00  HGB 13.6 8.5* 8.4*  HCT 43.5 27* 27*  MCV 93.5  --   --   PLT 282 398 425*   No results found for: TSH Lab Results  Component Value Date   HGBA1C 6.5 (H) 05/07/2024   No results found for: CHOL, HDL, LDLCALC, LDLDIRECT, TRIG, CHOLHDL  Significant Diagnostic Results in last 30 days:  DG Lumbar Spine 2-3 Views Result Date: 05/12/2024 CLINICAL DATA:  461500 Elective surgery 461500 EXAM: LUMBAR SPINE - 2-3 VIEW COMPARISON:  None Available. FINDINGS: Several spot radiographs during the various phases of lower lumbar spinal fixation were submitted for review. There is L3 through L5 ongoing fusion with transpedicular screws and intervertebral disc spacers. Please refer to the separately issued operative report for further details. Fluoroscopy time: 72.3 seconds Radiation dose: 72.21 mGy IMPRESSION: Intraoperative fluoroscopy provided for lower lumbar spinal fixation. Electronically Signed   By:  Ree Molt M.D.   On: 05/12/2024 11:15  DG C-Arm 1-60 Min-No Report Result Date: 05/12/2024 Fluoroscopy was utilized by the requesting physician.  No radiographic interpretation.   DG C-Arm 1-60 Min-No Report Result Date: 05/12/2024 Fluoroscopy was utilized by the requesting physician.  No radiographic interpretation.   DG C-Arm 1-60 Min-No Report Result Date: 05/12/2024 Fluoroscopy was utilized by the requesting physician.  No radiographic interpretation.   Assessment/Plan  1. S/P lumbar fusion (Primary) due to spinal stenosis.  - Pt scheduled to return home tomorrow after rehabilitation post PLIF of L3, 4, and 5 performed on May 12, 2024.  - Surgery follow up appointment today- plans to discuss further needs for PT - Continue wearing back brace when up, continue taking Tylenol  for pain relief - Hydrocodone  discontinued, pt states she has tramadol  at home should she need it - Continue proper body mechanics and continue walking with walker  2. Constipation, unspecified constipation type - Resolved, continue dulcolax and miralax as prescribed - Patient refusing to take at this time due to loose stools  3. Severe obesity (BMI 35.0-35.9 with comorbidity) (HCC) - At baseline, advised to continue physical activity including walking as often as she can tolerate  4. Diabetes mellitus with stage 2 chronic kidney disease (HCC) - Stable, most recent Hgb A1C 6.5 - Continue scheduled Jardiance  and metformin  - Follow up labs to be completed by PCP  5. Osteopenia, unspecified location - Stable with no acute issues, continue to monitor this with PCP  6. Insomnia, unspecified type - Chronic issue - improved. Discontinue Rozerem  7. Intermittent asthma without complication, unspecified asthma severity - Stable with no acute exacerbations, continue albuterol as prescribed  8. Post-op anemia No signs of blood loss, hgb 8.4 on recent lab Asymptomatic- to follow up with PCP   pt is stable  for discharge-will need PT/OT per home health no DME needed Has follow up scheduled for 06/02/24  Irfat Avie, AGNP Student - [employer] I personally was present during the history, physical exam and medical decision-making activities of this service and have verified that the service and findings are accurately documented in the student's note  Shaquel Josephson K. Caro BODILY [facility] [phone]

## 2024-05-30 DIAGNOSIS — D6489 Other specified anemias: Secondary | ICD-10-CM | POA: Diagnosis not present

## 2024-05-30 DIAGNOSIS — E1122 Type 2 diabetes mellitus with diabetic chronic kidney disease: Secondary | ICD-10-CM | POA: Diagnosis not present

## 2024-05-30 DIAGNOSIS — Z853 Personal history of malignant neoplasm of breast: Secondary | ICD-10-CM | POA: Diagnosis not present

## 2024-05-30 DIAGNOSIS — Z6835 Body mass index (BMI) 35.0-35.9, adult: Secondary | ICD-10-CM | POA: Diagnosis not present

## 2024-05-30 DIAGNOSIS — Z79891 Long term (current) use of opiate analgesic: Secondary | ICD-10-CM | POA: Diagnosis not present

## 2024-05-30 DIAGNOSIS — M48062 Spinal stenosis, lumbar region with neurogenic claudication: Secondary | ICD-10-CM | POA: Diagnosis not present

## 2024-05-30 DIAGNOSIS — M1990 Unspecified osteoarthritis, unspecified site: Secondary | ICD-10-CM | POA: Diagnosis not present

## 2024-05-30 DIAGNOSIS — Z79899 Other long term (current) drug therapy: Secondary | ICD-10-CM | POA: Diagnosis not present

## 2024-05-30 DIAGNOSIS — M8580 Other specified disorders of bone density and structure, unspecified site: Secondary | ICD-10-CM | POA: Diagnosis not present

## 2024-05-30 DIAGNOSIS — Z7984 Long term (current) use of oral hypoglycemic drugs: Secondary | ICD-10-CM | POA: Diagnosis not present

## 2024-05-30 DIAGNOSIS — E538 Deficiency of other specified B group vitamins: Secondary | ICD-10-CM | POA: Diagnosis not present

## 2024-05-30 DIAGNOSIS — M5416 Radiculopathy, lumbar region: Secondary | ICD-10-CM | POA: Diagnosis not present

## 2024-05-30 DIAGNOSIS — N182 Chronic kidney disease, stage 2 (mild): Secondary | ICD-10-CM | POA: Diagnosis not present

## 2024-05-30 DIAGNOSIS — I129 Hypertensive chronic kidney disease with stage 1 through stage 4 chronic kidney disease, or unspecified chronic kidney disease: Secondary | ICD-10-CM | POA: Diagnosis not present

## 2024-05-30 DIAGNOSIS — Z9011 Acquired absence of right breast and nipple: Secondary | ICD-10-CM | POA: Diagnosis not present

## 2024-05-30 DIAGNOSIS — Z4789 Encounter for other orthopedic aftercare: Secondary | ICD-10-CM | POA: Diagnosis not present

## 2024-05-30 DIAGNOSIS — Z4889 Encounter for other specified surgical aftercare: Secondary | ICD-10-CM | POA: Diagnosis not present

## 2024-05-30 DIAGNOSIS — K5900 Constipation, unspecified: Secondary | ICD-10-CM | POA: Diagnosis not present

## 2024-05-30 DIAGNOSIS — Z981 Arthrodesis status: Secondary | ICD-10-CM | POA: Diagnosis not present

## 2024-05-30 DIAGNOSIS — G4700 Insomnia, unspecified: Secondary | ICD-10-CM | POA: Diagnosis not present

## 2024-05-30 DIAGNOSIS — J4520 Mild intermittent asthma, uncomplicated: Secondary | ICD-10-CM | POA: Diagnosis not present

## 2024-06-02 DIAGNOSIS — J45909 Unspecified asthma, uncomplicated: Secondary | ICD-10-CM | POA: Diagnosis not present

## 2024-06-02 DIAGNOSIS — D649 Anemia, unspecified: Secondary | ICD-10-CM | POA: Diagnosis not present

## 2024-06-02 DIAGNOSIS — N182 Chronic kidney disease, stage 2 (mild): Secondary | ICD-10-CM | POA: Diagnosis not present

## 2024-06-02 DIAGNOSIS — E1122 Type 2 diabetes mellitus with diabetic chronic kidney disease: Secondary | ICD-10-CM | POA: Diagnosis not present

## 2024-06-05 DIAGNOSIS — I129 Hypertensive chronic kidney disease with stage 1 through stage 4 chronic kidney disease, or unspecified chronic kidney disease: Secondary | ICD-10-CM | POA: Diagnosis not present

## 2024-06-05 DIAGNOSIS — E538 Deficiency of other specified B group vitamins: Secondary | ICD-10-CM | POA: Diagnosis not present

## 2024-06-05 DIAGNOSIS — M48062 Spinal stenosis, lumbar region with neurogenic claudication: Secondary | ICD-10-CM | POA: Diagnosis not present

## 2024-06-05 DIAGNOSIS — N182 Chronic kidney disease, stage 2 (mild): Secondary | ICD-10-CM | POA: Diagnosis not present

## 2024-06-05 DIAGNOSIS — E1122 Type 2 diabetes mellitus with diabetic chronic kidney disease: Secondary | ICD-10-CM | POA: Diagnosis not present

## 2024-06-05 DIAGNOSIS — M1990 Unspecified osteoarthritis, unspecified site: Secondary | ICD-10-CM | POA: Diagnosis not present

## 2024-06-05 DIAGNOSIS — M5416 Radiculopathy, lumbar region: Secondary | ICD-10-CM | POA: Diagnosis not present

## 2024-06-24 DIAGNOSIS — M5416 Radiculopathy, lumbar region: Secondary | ICD-10-CM | POA: Diagnosis not present

## 2024-06-24 DIAGNOSIS — Z6834 Body mass index (BMI) 34.0-34.9, adult: Secondary | ICD-10-CM | POA: Diagnosis not present

## 2024-07-07 DIAGNOSIS — M4326 Fusion of spine, lumbar region: Secondary | ICD-10-CM | POA: Diagnosis not present

## 2024-07-07 DIAGNOSIS — R262 Difficulty in walking, not elsewhere classified: Secondary | ICD-10-CM | POA: Diagnosis not present

## 2024-07-07 DIAGNOSIS — M5459 Other low back pain: Secondary | ICD-10-CM | POA: Diagnosis not present

## 2024-07-16 DIAGNOSIS — M5459 Other low back pain: Secondary | ICD-10-CM | POA: Diagnosis not present

## 2024-07-16 DIAGNOSIS — M4326 Fusion of spine, lumbar region: Secondary | ICD-10-CM | POA: Diagnosis not present

## 2024-07-16 DIAGNOSIS — R262 Difficulty in walking, not elsewhere classified: Secondary | ICD-10-CM | POA: Diagnosis not present

## 2024-07-17 DIAGNOSIS — E7800 Pure hypercholesterolemia, unspecified: Secondary | ICD-10-CM | POA: Diagnosis not present

## 2024-07-17 DIAGNOSIS — N182 Chronic kidney disease, stage 2 (mild): Secondary | ICD-10-CM | POA: Diagnosis not present

## 2024-07-17 DIAGNOSIS — E1122 Type 2 diabetes mellitus with diabetic chronic kidney disease: Secondary | ICD-10-CM | POA: Diagnosis not present

## 2024-07-24 DIAGNOSIS — E7800 Pure hypercholesterolemia, unspecified: Secondary | ICD-10-CM | POA: Diagnosis not present

## 2024-07-24 DIAGNOSIS — M4326 Fusion of spine, lumbar region: Secondary | ICD-10-CM | POA: Diagnosis not present

## 2024-07-24 DIAGNOSIS — Z23 Encounter for immunization: Secondary | ICD-10-CM | POA: Diagnosis not present

## 2024-07-24 DIAGNOSIS — Z6835 Body mass index (BMI) 35.0-35.9, adult: Secondary | ICD-10-CM | POA: Diagnosis not present

## 2024-07-24 DIAGNOSIS — E1122 Type 2 diabetes mellitus with diabetic chronic kidney disease: Secondary | ICD-10-CM | POA: Diagnosis not present

## 2024-07-24 DIAGNOSIS — J45909 Unspecified asthma, uncomplicated: Secondary | ICD-10-CM | POA: Diagnosis not present

## 2024-07-24 DIAGNOSIS — M5459 Other low back pain: Secondary | ICD-10-CM | POA: Diagnosis not present

## 2024-07-24 DIAGNOSIS — R262 Difficulty in walking, not elsewhere classified: Secondary | ICD-10-CM | POA: Diagnosis not present

## 2024-07-24 DIAGNOSIS — Z1211 Encounter for screening for malignant neoplasm of colon: Secondary | ICD-10-CM | POA: Diagnosis not present

## 2024-07-24 DIAGNOSIS — Z1331 Encounter for screening for depression: Secondary | ICD-10-CM | POA: Diagnosis not present

## 2024-07-24 DIAGNOSIS — Z0000 Encounter for general adult medical examination without abnormal findings: Secondary | ICD-10-CM | POA: Diagnosis not present

## 2024-07-24 DIAGNOSIS — N182 Chronic kidney disease, stage 2 (mild): Secondary | ICD-10-CM | POA: Diagnosis not present

## 2024-07-29 DIAGNOSIS — R262 Difficulty in walking, not elsewhere classified: Secondary | ICD-10-CM | POA: Diagnosis not present

## 2024-07-29 DIAGNOSIS — M5459 Other low back pain: Secondary | ICD-10-CM | POA: Diagnosis not present

## 2024-07-29 DIAGNOSIS — M4326 Fusion of spine, lumbar region: Secondary | ICD-10-CM | POA: Diagnosis not present

## 2024-08-01 DIAGNOSIS — M4326 Fusion of spine, lumbar region: Secondary | ICD-10-CM | POA: Diagnosis not present

## 2024-08-01 DIAGNOSIS — M5459 Other low back pain: Secondary | ICD-10-CM | POA: Diagnosis not present

## 2024-08-01 DIAGNOSIS — R262 Difficulty in walking, not elsewhere classified: Secondary | ICD-10-CM | POA: Diagnosis not present

## 2024-08-05 DIAGNOSIS — M5459 Other low back pain: Secondary | ICD-10-CM | POA: Diagnosis not present

## 2024-08-05 DIAGNOSIS — M5416 Radiculopathy, lumbar region: Secondary | ICD-10-CM | POA: Diagnosis not present

## 2024-08-05 DIAGNOSIS — M4326 Fusion of spine, lumbar region: Secondary | ICD-10-CM | POA: Diagnosis not present

## 2024-08-05 DIAGNOSIS — R262 Difficulty in walking, not elsewhere classified: Secondary | ICD-10-CM | POA: Diagnosis not present

## 2024-08-22 DIAGNOSIS — Z961 Presence of intraocular lens: Secondary | ICD-10-CM | POA: Diagnosis not present

## 2024-08-22 DIAGNOSIS — E119 Type 2 diabetes mellitus without complications: Secondary | ICD-10-CM | POA: Diagnosis not present

## 2024-08-22 DIAGNOSIS — H43813 Vitreous degeneration, bilateral: Secondary | ICD-10-CM | POA: Diagnosis not present

## 2024-09-02 DIAGNOSIS — M5416 Radiculopathy, lumbar region: Secondary | ICD-10-CM | POA: Diagnosis not present

## 2024-09-02 DIAGNOSIS — M791 Myalgia, unspecified site: Secondary | ICD-10-CM | POA: Diagnosis not present

## 2024-09-09 DIAGNOSIS — M4326 Fusion of spine, lumbar region: Secondary | ICD-10-CM | POA: Diagnosis not present

## 2024-09-11 DIAGNOSIS — M4326 Fusion of spine, lumbar region: Secondary | ICD-10-CM | POA: Diagnosis not present

## 2024-09-17 DIAGNOSIS — M5416 Radiculopathy, lumbar region: Secondary | ICD-10-CM | POA: Diagnosis not present

## 2024-09-18 DIAGNOSIS — M4326 Fusion of spine, lumbar region: Secondary | ICD-10-CM | POA: Diagnosis not present
